# Patient Record
Sex: Female | Born: 1990
Health system: Southern US, Community
[De-identification: ages and names within clinical notes are randomized; demographics above are authoritative.]

## PROBLEM LIST (undated history)

## (undated) DIAGNOSIS — I509 Heart failure, unspecified: Secondary | ICD-10-CM

## (undated) DIAGNOSIS — E119 Type 2 diabetes mellitus without complications: Secondary | ICD-10-CM

## (undated) DIAGNOSIS — E7800 Pure hypercholesterolemia, unspecified: Secondary | ICD-10-CM

## (undated) DIAGNOSIS — I10 Essential (primary) hypertension: Secondary | ICD-10-CM

## (undated) DIAGNOSIS — K219 Gastro-esophageal reflux disease without esophagitis: Secondary | ICD-10-CM

## (undated) DIAGNOSIS — H547 Unspecified visual loss: Secondary | ICD-10-CM

## (undated) HISTORY — PX: INCISION AND DRAINAGE BREAST ABSCESS: SUR672

---

## 2010-02-18 ENCOUNTER — Emergency Department (HOSPITAL_BASED_OUTPATIENT_CLINIC_OR_DEPARTMENT_OTHER): Admission: EM | Admit: 2010-02-18 | Discharge: 2010-02-18 | Payer: Self-pay | Admitting: Emergency Medicine

## 2010-03-19 ENCOUNTER — Encounter: Payer: Self-pay | Admitting: Emergency Medicine

## 2010-03-19 ENCOUNTER — Inpatient Hospital Stay (HOSPITAL_COMMUNITY): Admission: AD | Admit: 2010-03-19 | Discharge: 2010-03-21 | Payer: Self-pay | Admitting: Internal Medicine

## 2010-03-19 ENCOUNTER — Ambulatory Visit: Payer: Self-pay | Admitting: Diagnostic Radiology

## 2010-09-17 LAB — DIFFERENTIAL
Basophils Absolute: 0.1 10*3/uL (ref 0.0–0.1)
Basophils Absolute: 0 10*3/uL (ref 0.0–0.1)
Basophils Relative: 0 % (ref 0–1)
Basophils Relative: 0 % (ref 0–1)
Eosinophils Absolute: 0 10*3/uL (ref 0.0–0.7)
Eosinophils Absolute: 0 10*3/uL (ref 0.0–0.7)
Eosinophils Relative: 1 % (ref 0–5)
Lymphocytes Relative: 57 % — ABNORMAL HIGH (ref 12–46)
Lymphs Abs: 3.1 10*3/uL (ref 0.7–4.0)
Monocytes Relative: 8 % (ref 3–12)
Monocytes Relative: 9 % (ref 3–12)
Neutro Abs: 15.4 10*3/uL — ABNORMAL HIGH (ref 1.7–7.7)
Neutro Abs: 3.1 10*3/uL (ref 1.7–7.7)
Neutrophils Relative %: 73 % (ref 43–77)
Neutrophils Relative %: 72 % (ref 43–77)

## 2010-09-17 LAB — CULTURE, BLOOD (ROUTINE X 2)
Culture  Setup Time: 201109151630
Culture: NO GROWTH
Culture: NO GROWTH

## 2010-09-17 LAB — BASIC METABOLIC PANEL
BUN: 14 mg/dL (ref 6–23)
BUN: 20 mg/dL (ref 6–23)
BUN: 8 mg/dL (ref 6–23)
CO2: 14 mEq/L — ABNORMAL LOW (ref 19–32)
CO2: 15 mEq/L — ABNORMAL LOW (ref 19–32)
CO2: 16 mEq/L — ABNORMAL LOW (ref 19–32)
CO2: 16 mEq/L — ABNORMAL LOW (ref 19–32)
CO2: 19 mEq/L (ref 19–32)
Calcium: 8.1 mg/dL — ABNORMAL LOW (ref 8.4–10.5)
Calcium: 8.7 mg/dL (ref 8.4–10.5)
Chloride: 106 mEq/L (ref 96–112)
Chloride: 107 mEq/L (ref 96–112)
Chloride: 109 mEq/L (ref 96–112)
Chloride: 110 mEq/L (ref 96–112)
Creatinine, Ser: 0.85 mg/dL (ref 0.4–1.2)
Creatinine, Ser: 0.9 mg/dL (ref 0.4–1.2)
Creatinine, Ser: 1.38 mg/dL — ABNORMAL HIGH (ref 0.4–1.2)
GFR calc Af Amer: 58 mL/min — ABNORMAL LOW (ref >60)
GFR calc Af Amer: >60 mL/min (ref >60)
GFR calc Af Amer: >60 mL/min (ref >60)
GFR calc non Af Amer: 58 mL/min — ABNORMAL LOW (ref >60)
GFR calc non Af Amer: >60 mL/min (ref >60)
GFR calc non Af Amer: >60 mL/min (ref >60)
GFR calc non Af Amer: >60 mL/min (ref >60)
Glucose, Bld: 196 mg/dL — ABNORMAL HIGH (ref 70–99)
Glucose, Bld: 276 mg/dL — ABNORMAL HIGH (ref 70–99)
Glucose, Bld: 292 mg/dL — ABNORMAL HIGH (ref 70–99)
Glucose, Bld: 400 mg/dL — ABNORMAL HIGH (ref 70–99)
Potassium: 3.3 mEq/L — ABNORMAL LOW (ref 3.5–5.1)
Potassium: 3.7 mEq/L (ref 3.5–5.1)
Potassium: 4.2 mEq/L (ref 3.5–5.1)
Potassium: 4.3 mEq/L (ref 3.5–5.1)
Potassium: 4.6 mEq/L (ref 3.5–5.1)
Sodium: 134 mEq/L — ABNORMAL LOW (ref 135–145)
Sodium: 136 mEq/L (ref 135–145)
Sodium: 137 mEq/L (ref 135–145)
Sodium: 138 mEq/L (ref 135–145)

## 2010-09-17 LAB — BLOOD GAS, ARTERIAL
Acid-base deficit: 12.3 mmol/L — ABNORMAL HIGH (ref 0.0–2.0)
Drawn by: 229971
pCO2 arterial: 25.2 mmHg — ABNORMAL LOW (ref 35.0–45.0)
pH, Arterial: 7.31 — ABNORMAL LOW (ref 7.350–7.400)

## 2010-09-17 LAB — COMPREHENSIVE METABOLIC PANEL
ALT: 23 U/L (ref 0–35)
Alkaline Phosphatase: 120 U/L — ABNORMAL HIGH (ref 39–117)
BUN: 31 mg/dL — ABNORMAL HIGH (ref 6–23)
CO2: 12 mEq/L — ABNORMAL LOW (ref 19–32)
Chloride: 104 mEq/L (ref 96–112)
Glucose, Bld: 183 mg/dL — ABNORMAL HIGH (ref 70–99)
Potassium: 5.2 mEq/L — ABNORMAL HIGH (ref 3.5–5.1)
Sodium: 144 mEq/L (ref 135–145)
Total Bilirubin: 1.1 mg/dL (ref 0.3–1.2)
Total Protein: 10.8 g/dL — ABNORMAL HIGH (ref 6.0–8.3)

## 2010-09-17 LAB — CBC
HCT: 47.2 % — ABNORMAL HIGH (ref 36.0–46.0)
HCT: 38.4 % (ref 36.0–46.0)
Hemoglobin: 15.9 g/dL — ABNORMAL HIGH (ref 12.0–15.0)
Hemoglobin: 12.9 g/dL (ref 12.0–15.0)
MCH: 26.6 pg (ref 26.0–34.0)
MCHC: 34.1 g/dL (ref 30.0–36.0)
MCV: 78.7 fL (ref 78.0–100.0)
MCV: 79.6 fL (ref 78.0–100.0)
Platelets: 238 10*3/uL (ref 150–400)
RBC: 6 MIL/uL — ABNORMAL HIGH (ref 3.87–5.11)
RBC: 4.83 MIL/uL (ref 3.87–5.11)
RDW: 13.3 % (ref 11.5–15.5)
RDW: 14.3 % (ref 11.5–15.5)
WBC: 21 10*3/uL — ABNORMAL HIGH (ref 4.0–10.5)
WBC: 8.4 10*3/uL (ref 4.0–10.5)

## 2010-09-17 LAB — URINALYSIS, ROUTINE W REFLEX MICROSCOPIC
Glucose, UA: 250 mg/dL — AB
Ketones, ur: >80 mg/dL — AB
Protein, ur: 30 mg/dL — AB
pH: 5.5 (ref 5.0–8.0)

## 2010-09-17 LAB — GLUCOSE, CAPILLARY
Glucose-Capillary: 190 mg/dL — ABNORMAL HIGH (ref 70–99)
Glucose-Capillary: 153 mg/dL — ABNORMAL HIGH (ref 70–99)
Glucose-Capillary: 228 mg/dL — ABNORMAL HIGH (ref 70–99)
Glucose-Capillary: 237 mg/dL — ABNORMAL HIGH (ref 70–99)
Glucose-Capillary: 257 mg/dL — ABNORMAL HIGH (ref 70–99)
Glucose-Capillary: 265 mg/dL — ABNORMAL HIGH (ref 70–99)
Glucose-Capillary: 271 mg/dL — ABNORMAL HIGH (ref 70–99)
Glucose-Capillary: 292 mg/dL — ABNORMAL HIGH (ref 70–99)

## 2010-09-17 LAB — URINE CULTURE: Culture  Setup Time: 201109151814

## 2010-09-17 LAB — HEPATIC FUNCTION PANEL
ALT: 16 U/L (ref 0–35)
AST: 18 U/L (ref 0–37)
Albumin: 3.3 g/dL — ABNORMAL LOW (ref 3.5–5.2)
Total Protein: 6.6 g/dL (ref 6.0–8.3)

## 2010-09-17 LAB — HIV ANTIBODY (ROUTINE TESTING W REFLEX): HIV: NONREACTIVE

## 2010-09-17 LAB — URINE MICROSCOPIC-ADD ON

## 2010-09-17 LAB — HEMOGLOBIN A1C: Mean Plasma Glucose: 364 mg/dL — ABNORMAL HIGH (ref <117)

## 2010-09-17 LAB — PREGNANCY, URINE: Preg Test, Ur: NEGATIVE

## 2010-09-17 LAB — LACTIC ACID, PLASMA: Lactic Acid, Venous: 1.6 mmol/L (ref 0.5–2.2)

## 2010-09-18 LAB — HEMOCCULT GUIAC POC 1CARD (OFFICE): Fecal Occult Bld: POSITIVE

## 2014-10-26 ENCOUNTER — Emergency Department (HOSPITAL_BASED_OUTPATIENT_CLINIC_OR_DEPARTMENT_OTHER): Payer: Worker's Compensation

## 2014-10-26 ENCOUNTER — Emergency Department (HOSPITAL_BASED_OUTPATIENT_CLINIC_OR_DEPARTMENT_OTHER)
Admission: EM | Admit: 2014-10-26 | Discharge: 2014-10-26 | Disposition: A | Discharge status: Home / Self Care | Payer: Worker's Compensation | Attending: Emergency Medicine | Admitting: Emergency Medicine

## 2014-10-26 ENCOUNTER — Encounter (HOSPITAL_BASED_OUTPATIENT_CLINIC_OR_DEPARTMENT_OTHER): Payer: Self-pay

## 2014-10-26 DIAGNOSIS — Z794 Long term (current) use of insulin: Secondary | ICD-10-CM | POA: Diagnosis not present

## 2014-10-26 DIAGNOSIS — E119 Type 2 diabetes mellitus without complications: Secondary | ICD-10-CM | POA: Diagnosis not present

## 2014-10-26 DIAGNOSIS — S99912A Unspecified injury of left ankle, initial encounter: Secondary | ICD-10-CM | POA: Diagnosis present

## 2014-10-26 DIAGNOSIS — Y998 Other external cause status: Secondary | ICD-10-CM | POA: Insufficient documentation

## 2014-10-26 DIAGNOSIS — Y9389 Activity, other specified: Secondary | ICD-10-CM | POA: Insufficient documentation

## 2014-10-26 DIAGNOSIS — Y9289 Other specified places as the place of occurrence of the external cause: Secondary | ICD-10-CM | POA: Diagnosis not present

## 2014-10-26 DIAGNOSIS — X58XXXA Exposure to other specified factors, initial encounter: Secondary | ICD-10-CM | POA: Diagnosis not present

## 2014-10-26 DIAGNOSIS — M25572 Pain in left ankle and joints of left foot: Secondary | ICD-10-CM

## 2014-10-26 HISTORY — DX: Type 2 diabetes mellitus without complications: E11.9

## 2014-10-26 MED ORDER — IBUPROFEN 800 MG PO TABS
800.0000 mg | ORAL_TABLET | Freq: Once | ORAL | Status: AC
  Administered 2014-10-26: 800 mg via ORAL
  Filled 2014-10-26: qty 1

## 2014-10-26 MED ORDER — IBUPROFEN 600 MG PO TABS: 600.0000 mg | ORAL_TABLET | Freq: Three times a day (TID) | ORAL | Status: AC

## 2014-10-26 NOTE — Discharge Instructions (Signed)
As discussed your pain is likely due to a sprain.  However, it is important that you have repeat evaluation in one week appropriate resolution.  Return here for concerning changes in your condition.

## 2014-10-26 NOTE — ED Provider Notes (Signed)
CSN: [id number]     Arrival date & time 10/26/14  1820 History  This chart was scribed for Gerhard Munchobert Gurtej Noyola, MD by [place] Peace, ED Scribe. The patient was seen in [facility]13/[facility]. The patient's care was started at 7:16 PM.    Chief Complaint  Patient presents with  . Ankle Injury      Patient is a 24 y.o. female presenting with lower extremity injury. The history is provided by the patient. No language interpreter was used.  Ankle Injury This is a new problem. The current episode started 1 to 2 hours ago. The problem occurs constantly. The symptoms are aggravated by walking and standing.  HPI Comments: Tricia Boone is a [age] y.o. female who presents to the Emergency Department complaining of left ankle injury that occurred earlier today while pt was coming down a slide and put her foot out to stop herself and rolled her ankle. Pt is not sure in which direction she twisted her ankle but reports pain is mostly to medial aspect of her ankle. She now complains of severe left ankle pain, swelling, and tenderness. History of DM. Pt is non-smoker.    Past Medical History  Diagnosis Date  . Diabetes mellitus without complication    History reviewed. No pertinent past surgical history. No family history on file. History  Substance Use Topics  . Smoking status: Never Smoker   . Smokeless tobacco: Not on file  . Alcohol Use: No   OB History    No data available     Review of Systems  All other systems reviewed and are negative.     Allergies  Review of patient's allergies indicates no known allergies.  Home Medications   Prior to Admission medications   Medication Sig Start Date End Date Taking? Authorizing Provider  insulin aspart (NOVOLOG) 100 UNIT/ML injection Inject into the skin 3 (three) times daily before meals.   Yes Historical Provider, MD  insulin detemir (LEVEMIR) 100 UNIT/ML injection Inject into the skin at bedtime.   Yes Historical Provider, MD  ibuprofen  (ADVIL,MOTRIN) 600 MG tablet Take 1 tablet (600 mg total) by mouth 3 (three) times daily. 10/26/14 10/29/14  Gerhard Munchobert Eilidh Marcano, MD   BP 131/99 mmHg  Pulse 95  Temp(Src) 98.8 F (37.1 C) (Oral)  Resp 18  Ht 5' 2.5" (1.588 m)  Wt 130 lb (58.968 kg)  BMI 23.38 kg/m2  SpO2 100%  LMP 10/15/2014 Physical Exam  Constitutional: She is oriented to person, place, and time. She appears well-developed and well-nourished. No distress.  HENT:  Head: Normocephalic and atraumatic.  Eyes: Conjunctivae and EOM are normal.  Cardiovascular: Normal rate and regular rhythm.   Pulmonary/Chest: Effort normal and breath sounds normal. No stridor. No respiratory distress.  Abdominal: She exhibits no distension.  Musculoskeletal: She exhibits edema and tenderness.  Tenderness to anterior medial malleolus and TFL. More pain with eversion compared to inversion.   Neurological: She is alert and oriented to person, place, and time. No cranial nerve deficit.  Skin: Skin is warm and dry.  Psychiatric: She has a normal mood and affect.  Nursing note and vitals reviewed.   ED Course  Procedures (including critical care time) Labs Review Labs Reviewed - No data to display  Imaging Review Dg Ankle Complete Left  10/26/2014   CLINICAL DATA:  LEFT foot injury. Twisted LEFT foot. Ankle pain and swelling.  EXAM: LEFT ANKLE COMPLETE - 3+ VIEW  COMPARISON:  None.  FINDINGS: There is no evidence of fracture,  dislocation, or joint effusion. There is no evidence of arthropathy or other focal bone abnormality. Soft tissues are unremarkable.  IMPRESSION: Negative.   Electronically Signed   By: Andreas Newport M.D.   On: 10/26/2014 19:28   Dg Foot Complete Left  10/26/2014   CLINICAL DATA:  Left ankle trauma and twisting injury with pain  EXAM: LEFT FOOT - COMPLETE 3+ VIEW  COMPARISON:  None.  FINDINGS: There is no evidence of fracture or dislocation. There is no evidence of arthropathy or other focal bone abnormality. Soft  tissues are unremarkable.  IMPRESSION: Negative.   Electronically Signed   By: Christiana Pellant M.D.   On: 10/26/2014 19:31  reviewed the x-rays, agree with the interpretation. I demonstrated them to the patient and her companion.    EKG Interpretation None     Medications  ibuprofen (ADVIL,MOTRIN) tablet 800 mg (800 mg Oral Given 10/26/14 2000)    7:21 PM- Treatment plan was discussed with patient who verbalizes understanding and agrees.   MDM   Final diagnoses:  Ankle pain, left     I personally performed the services described in this documentation, which was scribed in my presence. The recorded information has been reviewed and is accurate.   Well-appearing female presents after trauma to the left ankle. No evidence for proximal injury, nor for fracture on x-rays. Patient had splint, crutches, cryotherapy, analgesia, was discharged with a sports medicine follow-up.   Gerhard Munch, MD 10/27/14 [phone]

## 2014-10-26 NOTE — ED Notes (Addendum)
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

## 2014-11-03 ENCOUNTER — Encounter (HOSPITAL_BASED_OUTPATIENT_CLINIC_OR_DEPARTMENT_OTHER): Payer: Self-pay | Admitting: *Deleted

## 2014-11-03 ENCOUNTER — Emergency Department (HOSPITAL_BASED_OUTPATIENT_CLINIC_OR_DEPARTMENT_OTHER)
Admission: EM | Admit: 2014-11-03 | Discharge: 2014-11-03 | Disposition: A | Discharge status: Home / Self Care | Payer: Self-pay | Attending: Emergency Medicine | Admitting: Emergency Medicine

## 2014-11-03 DIAGNOSIS — E119 Type 2 diabetes mellitus without complications: Secondary | ICD-10-CM | POA: Insufficient documentation

## 2014-11-03 DIAGNOSIS — Z794 Long term (current) use of insulin: Secondary | ICD-10-CM | POA: Insufficient documentation

## 2014-11-03 DIAGNOSIS — L02214 Cutaneous abscess of groin: Secondary | ICD-10-CM | POA: Insufficient documentation

## 2014-11-03 LAB — CBG MONITORING, ED: GLUCOSE-CAPILLARY: 315 mg/dL — AB (ref 70–99)

## 2014-11-03 MED ORDER — DOXYCYCLINE HYCLATE 100 MG PO CAPS
100.0000 mg | ORAL_CAPSULE | Freq: Two times a day (BID) | ORAL | Status: DC
Start: 2014-11-03 — End: 2015-08-26

## 2014-11-03 MED ORDER — DOXYCYCLINE HYCLATE 100 MG PO TABS
100.0000 mg | ORAL_TABLET | Freq: Once | ORAL | Status: AC
  Administered 2014-11-03: 100 mg via ORAL
  Filled 2014-11-03: qty 1

## 2014-11-03 MED ORDER — NAPROXEN 500 MG PO TABS: 500.0000 mg | ORAL_TABLET | Freq: Two times a day (BID) | ORAL | Status: DC

## 2014-11-03 NOTE — Discharge Instructions (Signed)
Soak the area in warm water for 20 minutes each day. Take antibiotic as directed. Take Naprosyn as directed. Work note provided. Return for any new or worse symptoms. Would expect things to improve over the next 3 days.

## 2014-11-03 NOTE — ED Notes (Signed)
Presents to ED w/ c/o pain of possible abscess at rt side of suprpubic area, onset last PM, states has drainage this am.

## 2014-11-03 NOTE — ED Notes (Signed)
Reports abscess 'burst' this morning in her (R) groin.

## 2014-11-03 NOTE — ED Provider Notes (Signed)
CSN: [id number]     Arrival date & time 11/03/14  1447 History  This chart was scribed for Tricia MuldersScott Allsion Nogales, MD by Roxy Cedarhandni Bhalodia, ED Scribe. This patient was seen in room MH04/MH04 and the patient's care was started at 3:42 PM.   Chief Complaint  Patient presents with  . Abscess   Patient is a 24 y.o. female presenting with abscess. The history is provided by the patient. No language interpreter was used.  Abscess Location:  Torso Torso abscess location:  Abd RLQ Associated symptoms: no fever, no headaches, no nausea and no vomiting    HPI Comments: Tricia Boone is a 24 y.o. female with a PMHx of diabetes, who presents to the Emergency Department complaining of abscess to right side of pubic area onset 3 days ago. She states that it initially began looking like a "hair bump" a few days ago and went away. She states that the bump reappearedPatient states that the abscess began draining this morning. Patient has no known allergies. Patient denies chance of pregnancy.  Past Medical History  Diagnosis Date  . Diabetes mellitus without complication    History reviewed. No pertinent past surgical history. History reviewed. No pertinent family history. History  Substance Use Topics  . Smoking status: Never Smoker   . Smokeless tobacco: Not on file  . Alcohol Use: No   OB History    No data available     Review of Systems  Constitutional: Negative for fever and chills.  HENT: Negative for congestion, rhinorrhea and sore throat.   Eyes: Negative for visual disturbance.  Respiratory: Negative for cough and shortness of breath.   Cardiovascular: Negative for chest pain and leg swelling.  Gastrointestinal: Negative for nausea, vomiting and diarrhea.  Genitourinary: Negative for dysuria.  Musculoskeletal: Negative for back pain.  Skin: Positive for wound (abscess). Negative for rash.  Neurological: Negative for headaches.  Hematological: Does not bruise/bleed easily.   Psychiatric/Behavioral: Negative for confusion.   Allergies  Review of patient's allergies indicates no known allergies.  Home Medications   Prior to Admission medications   Medication Sig Start Date End Date Taking? Authorizing Provider  insulin aspart (NOVOLOG) 100 UNIT/ML injection Inject into the skin 3 (three) times daily before meals.    Historical Provider, MD  insulin detemir (LEVEMIR) 100 UNIT/ML injection Inject into the skin at bedtime.    Historical Provider, MD   Triage Vitals: BP 137/90 mmHg  Pulse 102  Temp(Src) 98.4 F (36.9 C) (Oral)  Resp 16  Ht 5\' 2"  (1.575 m)  Wt 125 lb (56.7 kg)  BMI 22.86 kg/m2  SpO2 100%  LMP 10/15/2014  Physical Exam  Constitutional: She is oriented to person, place, and time. She appears well-developed and well-nourished. No distress.  HENT:  Head: Normocephalic and atraumatic.  Mouth/Throat: Oropharynx is clear and moist. No oropharyngeal exudate.  Eyes: Conjunctivae and EOM are normal. Pupils are equal, round, and reactive to light. Right eye exhibits no discharge. Left eye exhibits no discharge. No scleral icterus.  Cardiovascular: Normal rate, regular rhythm and normal heart sounds.   No murmur heard. Pulmonary/Chest: Effort normal and breath sounds normal. No respiratory distress.  Abdominal: Soft. Bowel sounds are normal. There is no tenderness.  Musculoskeletal:  Ankle wrap noted to left ankle. No swelling noted to ankles bilaterally.  Neurological: She is alert and oriented to person, place, and time.  Skin: She is not diaphoretic. There is erythema.  Right groin area: induration is 2cm; oozing puss in area  of 5 cm. Redness in area of 1 cm. No swelling in labia on right side.  Psychiatric: She has a normal mood and affect. Her behavior is normal.  Nursing note and vitals reviewed.  ED Course  Procedures (including critical care time)  DIAGNOSTIC STUDIES: Oxygen Saturation is 100% on RA, normal by my interpretation.     COORDINATION OF CARE: 3:47 PM- Discussed plans to give patient presecription for doxycycyline. Advised patient to soak. Pt advised of plan for treatment and pt agrees.  Labs Review Labs Reviewed  CBG MONITORING, ED - Abnormal; Notable for the following:    Glucose-Capillary 315 (*)    All other components within normal limits   Imaging Review No results found.   EKG Interpretation None     MDM   Final diagnoses:  None    Patient with small right groin area abscess most likely infected hair follicle already draining pus. Will treat with warm water soaks and antibiotic and anti-inflammatory medicine. Patient given precautions.  Patient is known diabetic. Patient without any other significant symptoms. No fevers. Blood sugar is slightly elevated here today at 315.  I personally performed the services described in this documentation, which was scribed in my presence. The recorded information has been reviewed and is accurate.    Tricia Mulders, MD 11/03/14 [phone]

## 2014-11-09 ENCOUNTER — Encounter (HOSPITAL_BASED_OUTPATIENT_CLINIC_OR_DEPARTMENT_OTHER): Payer: Self-pay

## 2014-11-09 ENCOUNTER — Emergency Department (HOSPITAL_BASED_OUTPATIENT_CLINIC_OR_DEPARTMENT_OTHER)
Admission: EM | Admit: 2014-11-09 | Discharge: 2014-11-09 | Disposition: A | Discharge status: Home / Self Care | Payer: Self-pay | Attending: Emergency Medicine | Admitting: Emergency Medicine

## 2014-11-09 DIAGNOSIS — Z792 Long term (current) use of antibiotics: Secondary | ICD-10-CM | POA: Insufficient documentation

## 2014-11-09 DIAGNOSIS — M79641 Pain in right hand: Secondary | ICD-10-CM | POA: Insufficient documentation

## 2014-11-09 DIAGNOSIS — Z79899 Other long term (current) drug therapy: Secondary | ICD-10-CM | POA: Insufficient documentation

## 2014-11-09 DIAGNOSIS — M79642 Pain in left hand: Secondary | ICD-10-CM | POA: Insufficient documentation

## 2014-11-09 DIAGNOSIS — Z794 Long term (current) use of insulin: Secondary | ICD-10-CM | POA: Insufficient documentation

## 2014-11-09 DIAGNOSIS — E119 Type 2 diabetes mellitus without complications: Secondary | ICD-10-CM | POA: Insufficient documentation

## 2014-11-09 DIAGNOSIS — M79643 Pain in unspecified hand: Secondary | ICD-10-CM

## 2014-11-09 DIAGNOSIS — Z791 Long term (current) use of non-steroidal anti-inflammatories (NSAID): Secondary | ICD-10-CM | POA: Insufficient documentation

## 2014-11-09 MED ORDER — GABAPENTIN 100 MG PO CAPS: 100.0000 mg | ORAL_CAPSULE | Freq: Three times a day (TID) | ORAL | Status: DC

## 2014-11-09 MED ORDER — TRAMADOL HCL 50 MG PO TABS: 50.0000 mg | ORAL_TABLET | Freq: Four times a day (QID) | ORAL | Status: DC | PRN

## 2014-11-09 NOTE — ED Notes (Addendum)
MD in to see patient 

## 2014-11-09 NOTE — ED Notes (Signed)
Pt reports starting this morning with persistent pain at tips of fingers bilateral.  Hx of diabetes, no injury.

## 2014-11-09 NOTE — ED Provider Notes (Signed)
CSN: [id number]     Arrival date & time 11/09/14  1847 History  This chart was scribed for Tricia RazorStephen Darnice Comrie, MD by Tricia Boone, ED Scribe. This patient was seen in room MH07/MH07 and the patient's care was started at 7:46 PM.    Chief Complaint  Patient presents with  . Hand Pain     Patient is a 24 y.o. female presenting with hand pain. The history is provided by the patient. No language interpreter was used.  Hand Pain   HPI Comments: Tricia Boone is a 24 y.o. female with PMHx of DM who presents to the Emergency Department complaining of constant worsening throbbing sharp pain to all fingertips of bilateral hands. She states "feels like someone is stabbing me in my fingers." Pt reports touch and movement aggravates the pain. Pt denies any known alleviating factors. Pt denies h/o diabetic neuropathy. Pt denies known injury, swelling, numbness, tingling, and pain in bilateral LEs.   Pt was seen in ED on 11/03/14 for abscess to groin. She reports improvements in pain. She is unsure if it is healing properly.    Past Medical History  Diagnosis Date  . Diabetes mellitus without complication    Past Surgical History  Procedure Laterality Date  . Incision and drainage breast abscess     No family history on file. History  Substance Use Topics  . Smoking status: Never Smoker   . Smokeless tobacco: Not on file  . Alcohol Use: No   OB History    No data available     Review of Systems  Musculoskeletal: Positive for myalgias. Negative for joint swelling.  Neurological: Negative for weakness and numbness.  All other systems reviewed and are negative.     Allergies  Review of patient's allergies indicates no known allergies.  Home Medications   Prior to Admission medications   Medication Sig Start Date End Date Taking? Authorizing Provider  doxycycline (VIBRAMYCIN) 100 MG capsule Take 1 capsule (100 mg total) by mouth 2 (two) times daily. 11/03/14   Vanetta MuldersScott Zackowski, MD   insulin aspart (NOVOLOG) 100 UNIT/ML injection Inject into the skin 3 (three) times daily before meals.    Historical Provider, MD  insulin detemir (LEVEMIR) 100 UNIT/ML injection Inject into the skin at bedtime.    Historical Provider, MD  naproxen (NAPROSYN) 500 MG tablet Take 1 tablet (500 mg total) by mouth 2 (two) times daily. 11/03/14   Vanetta MuldersScott Zackowski, MD   BP 161/96 mmHg  Pulse 97  Temp(Src) 98.7 F (37.1 C) (Oral)  Resp 18  Ht 5' 2.5" (1.588 m)  Wt 125 lb (56.7 kg)  BMI 22.48 kg/m2  SpO2 100%  LMP 10/15/2014 Physical Exam  Constitutional: She appears well-developed and well-nourished. No distress.  HENT:  Head: Normocephalic and atraumatic.  Eyes: Conjunctivae are normal. Right eye exhibits no discharge. Left eye exhibits no discharge.  Neck: Neck supple.  Cardiovascular: Normal rate, regular rhythm and normal heart sounds.  Exam reveals no gallop and no friction rub.   No murmur heard. Pulmonary/Chest: Effort normal and breath sounds normal. No respiratory distress.  Abdominal: Soft. She exhibits no distension. There is no tenderness.  Musculoskeletal: She exhibits no edema or tenderness.  Hands and fingers normal in appearance no swelling or rash Sensation intact Brisk cap refill  Neurological: She is alert.  Skin: Skin is warm and dry.  Lesion to right mons consistent with healing abscess; no drainage, fluctuance, no cellulitis  Psychiatric: She has a normal mood and  affect. Her behavior is normal. Thought content normal.  Nursing note and vitals reviewed.   ED Course  Procedures (including critical care time) DIAGNOSTIC STUDIES: Oxygen Saturation is 100% on room air, normal by my interpretation.    COORDINATION OF CARE: 7:49 PM Discussed treatment plan with patient at beside, the patient agrees with the plan and has no further questions at this time.  Labs Review Labs Reviewed - No data to display  Imaging Review No results found.   EKG  Interpretation None      MDM   Final diagnoses:  Pain of hand, unspecified laterality   24yF with b/l finger tips pain. Unclear etiology. Long standing hx of diabetes. Consider neuropathy. Exam nonfocal. Fingers well perfused. No swelling or other skin changes. Neurologically intact. Plan symptomatic tx at this time. Abscess to R mons pt was recently evaluated for appears to be healing well.   I personally preformed the services scribed in my presence. The recorded information has been reviewed is accurate. Tricia RazorStephen Evaline Waltman, MD.     Tricia RazorStephen Treyton Slimp, MD 11/12/14 [phone]

## 2015-02-22 ENCOUNTER — Encounter (HOSPITAL_BASED_OUTPATIENT_CLINIC_OR_DEPARTMENT_OTHER): Payer: Self-pay | Admitting: *Deleted

## 2015-02-22 ENCOUNTER — Emergency Department (HOSPITAL_BASED_OUTPATIENT_CLINIC_OR_DEPARTMENT_OTHER)
Admission: EM | Admit: 2015-02-22 | Discharge: 2015-02-22 | Disposition: A | Discharge status: Home / Self Care | Payer: Self-pay | Attending: Emergency Medicine | Admitting: Emergency Medicine

## 2015-02-22 DIAGNOSIS — E1042 Type 1 diabetes mellitus with diabetic polyneuropathy: Secondary | ICD-10-CM | POA: Insufficient documentation

## 2015-02-22 DIAGNOSIS — Z791 Long term (current) use of non-steroidal anti-inflammatories (NSAID): Secondary | ICD-10-CM | POA: Insufficient documentation

## 2015-02-22 DIAGNOSIS — Z79899 Other long term (current) drug therapy: Secondary | ICD-10-CM | POA: Insufficient documentation

## 2015-02-22 DIAGNOSIS — Z794 Long term (current) use of insulin: Secondary | ICD-10-CM | POA: Insufficient documentation

## 2015-02-22 DIAGNOSIS — R202 Paresthesia of skin: Secondary | ICD-10-CM | POA: Insufficient documentation

## 2015-02-22 MED ORDER — GABAPENTIN 100 MG PO CAPS
100.0000 mg | ORAL_CAPSULE | Freq: Once | ORAL | Status: AC
  Administered 2015-02-22: 100 mg via ORAL
  Filled 2015-02-22: qty 1

## 2015-02-22 MED ORDER — GABAPENTIN 100 MG PO CAPS: 100.0000 mg | ORAL_CAPSULE | Freq: Three times a day (TID) | ORAL | Status: DC

## 2015-02-22 MED ORDER — TRAMADOL HCL 50 MG PO TABS: 50.0000 mg | ORAL_TABLET | Freq: Four times a day (QID) | ORAL | Status: DC | PRN

## 2015-02-22 MED ORDER — TRAMADOL HCL 50 MG PO TABS: 100.0000 mg | ORAL_TABLET | Freq: Once | ORAL | Status: DC

## 2015-02-22 MED ORDER — TRAMADOL HCL 50 MG PO TABS
50.0000 mg | ORAL_TABLET | Freq: Once | ORAL | Status: AC
  Administered 2015-02-22: 50 mg via ORAL
  Filled 2015-02-22: qty 1

## 2015-02-22 NOTE — ED Notes (Signed)
Pt admits to intermittent bilat hand numbness that radiates up both arms that began approx 0330 today.

## 2015-02-22 NOTE — ED Provider Notes (Addendum)
CSN: [id number]     Arrival date & time 02/22/15  0528 History   First MD Initiated Contact with Patient 02/22/15 0543     Chief Complaint  Patient presents with  . Hand Problem     (Consider location/radiation/quality/duration/timing/severity/associated sxs/prior Treatment) HPI  This is a 24 year old female with a history of type 1 diabetes and diabetic neuropathy. She was seen several months ago for pain and paresthesias in her fingertips and was prescribed gabapentin and tramadol. She had good relief from these medications but has not followed up with her PCP. She is here with pain and paresthesias in her fingertips that awakened her from sleep about 3:30 this morning. She states the pain is an 11 out of 10. The pain radiates up her forearms. It does not follow an ulnar or median nerve pattern. There is no motor deficit. Pain is reproduced with palpation or movement of her fingers.  Past Medical History  Diagnosis Date  . Diabetes mellitus without complication    Past Surgical History  Procedure Laterality Date  . Incision and drainage breast abscess     History reviewed. No pertinent family history. Social History  Substance Use Topics  . Smoking status: Never Smoker   . Smokeless tobacco: None  . Alcohol Use: No   OB History    No data available     Review of Systems  All other systems reviewed and are negative.   Allergies  Review of patient's allergies indicates no known allergies.  Home Medications   Prior to Admission medications   Medication Sig Start Date End Date Taking? Authorizing Provider  doxycycline (VIBRAMYCIN) 100 MG capsule Take 1 capsule (100 mg total) by mouth 2 (two) times daily. 11/03/14   Vanetta Mulders, MD  gabapentin (NEURONTIN) 100 MG capsule Take 1 capsule (100 mg total) by mouth 3 (three) times daily. 11/09/14   Raeford Razor, MD  insulin aspart (NOVOLOG) 100 UNIT/ML injection Inject into the skin 3 (three) times daily before meals.     Historical Provider, MD  insulin detemir (LEVEMIR) 100 UNIT/ML injection Inject into the skin at bedtime.    Historical Provider, MD  naproxen (NAPROSYN) 500 MG tablet Take 1 tablet (500 mg total) by mouth 2 (two) times daily. 11/03/14   Vanetta Mulders, MD  traMADol (ULTRAM) 50 MG tablet Take 1 tablet (50 mg total) by mouth every 6 (six) hours as needed. 11/09/14   Raeford Razor, MD   BP 134/87 mmHg  Pulse 92  Temp(Src) 98 F (36.7 C) (Oral)  Resp 20  Ht 5' 2.5" (1.588 m)  SpO2 100%  LMP 01/23/2015   Physical Exam  General: Well-developed, well-nourished female in no acute distress; appearance consistent with age of record HENT: normocephalic; atraumatic Eyes: Normal appearance Neck: supple Heart: regular rate and rhythm Lungs: clear to auscultation bilaterally Abdomen: soft; nondistended Extremities: No deformity; full range of motion; pulses normal Neurologic: Awake, alert and oriented; motor function intact in all extremities and symmetric; no facial droop; negative Phalen's; negative Tinel's bilaterally; pain reproducible on palpation of fingertips Skin: Warm and dry Psychiatric: Normal mood and affect    ED Course  Procedures (including critical care time)   MDM  History and exam consistent with diabetic neuropathy. She was encouraged follow-up with her primary care physician.   Paula Libra, MD 02/22/15 1191  Paula Libra, MD 02/22/15 [phone]

## 2015-05-15 ENCOUNTER — Encounter (HOSPITAL_BASED_OUTPATIENT_CLINIC_OR_DEPARTMENT_OTHER): Payer: Self-pay | Admitting: *Deleted

## 2015-05-15 ENCOUNTER — Emergency Department (HOSPITAL_BASED_OUTPATIENT_CLINIC_OR_DEPARTMENT_OTHER)
Admission: EM | Admit: 2015-05-15 | Discharge: 2015-05-15 | Disposition: A | Discharge status: Other Acute Inpatient Hospital | Payer: Self-pay | Attending: Emergency Medicine | Admitting: Emergency Medicine

## 2015-05-15 DIAGNOSIS — E872 Acidosis, unspecified: Secondary | ICD-10-CM

## 2015-05-15 DIAGNOSIS — Z79899 Other long term (current) drug therapy: Secondary | ICD-10-CM | POA: Insufficient documentation

## 2015-05-15 DIAGNOSIS — E1010 Type 1 diabetes mellitus with ketoacidosis without coma: Secondary | ICD-10-CM | POA: Insufficient documentation

## 2015-05-15 DIAGNOSIS — R0602 Shortness of breath: Secondary | ICD-10-CM | POA: Insufficient documentation

## 2015-05-15 DIAGNOSIS — R000 Tachycardia, unspecified: Secondary | ICD-10-CM | POA: Insufficient documentation

## 2015-05-15 DIAGNOSIS — Z794 Long term (current) use of insulin: Secondary | ICD-10-CM | POA: Insufficient documentation

## 2015-05-15 DIAGNOSIS — Z791 Long term (current) use of non-steroidal anti-inflammatories (NSAID): Secondary | ICD-10-CM | POA: Insufficient documentation

## 2015-05-15 DIAGNOSIS — Z3202 Encounter for pregnancy test, result negative: Secondary | ICD-10-CM | POA: Insufficient documentation

## 2015-05-15 DIAGNOSIS — E860 Dehydration: Secondary | ICD-10-CM | POA: Insufficient documentation

## 2015-05-15 LAB — CBG MONITORING, ED
GLUCOSE-CAPILLARY: 110 mg/dL — AB (ref 65–99)
Glucose-Capillary: 401 mg/dL — ABNORMAL HIGH (ref 65–99)
Glucose-Capillary: 116 mg/dL — ABNORMAL HIGH (ref 65–99)

## 2015-05-15 LAB — CBC WITH DIFFERENTIAL/PLATELET
BASOS ABS: 0 10*3/uL (ref 0.0–0.1)
Basophils Relative: 0 %
EOS ABS: 0 10*3/uL (ref 0.0–0.7)
Eosinophils Relative: 0 %
HCT: 37.5 % (ref 36.0–46.0)
Hemoglobin: 12.2 g/dL (ref 12.0–15.0)
LYMPHS ABS: 4.2 10*3/uL — AB (ref 0.7–4.0)
Lymphocytes Relative: 26 %
MCH: 25.8 pg — AB (ref 26.0–34.0)
MCHC: 32.5 g/dL (ref 30.0–36.0)
MCV: 79.3 fL (ref 78.0–100.0)
MONO ABS: 0.8 10*3/uL (ref 0.1–1.0)
Monocytes Relative: 5 %
NEUTROS ABS: 11.1 10*3/uL — AB (ref 1.7–7.7)
Neutrophils Relative %: 69 %
PLATELETS: 421 10*3/uL — AB (ref 150–400)
RBC: 4.73 MIL/uL (ref 3.87–5.11)
RDW: 16.1 % — AB (ref 11.5–15.5)
WBC: 16.1 10*3/uL — ABNORMAL HIGH (ref 4.0–10.5)

## 2015-05-15 LAB — BASIC METABOLIC PANEL
Anion gap: 25 — ABNORMAL HIGH (ref 5–15)
BUN: 27 mg/dL — ABNORMAL HIGH (ref 6–20)
CALCIUM: 8.8 mg/dL — AB (ref 8.9–10.3)
CO2: 9 mmol/L — ABNORMAL LOW (ref 22–32)
CREATININE: 1.61 mg/dL — AB (ref 0.44–1.00)
Chloride: 101 mmol/L (ref 101–111)
GFR calc Af Amer: 51 mL/min — ABNORMAL LOW (ref >60)
GFR, EST NON AFRICAN AMERICAN: 44 mL/min — AB (ref >60)
Glucose, Bld: 318 mg/dL — ABNORMAL HIGH (ref 65–99)
Potassium: 3.6 mmol/L (ref 3.5–5.1)
SODIUM: 135 mmol/L (ref 135–145)

## 2015-05-15 LAB — I-STAT CG4 LACTIC ACID, ED
LACTIC ACID, VENOUS: 9.18 mmol/L — AB (ref 0.5–2.0)
Lactic Acid, Venous: 8.93 mmol/L (ref 0.5–2.0)

## 2015-05-15 LAB — URINALYSIS, ROUTINE W REFLEX MICROSCOPIC
BILIRUBIN URINE: NEGATIVE
Hgb urine dipstick: NEGATIVE
Leukocytes, UA: NEGATIVE
Nitrite: NEGATIVE
PROTEIN: NEGATIVE mg/dL
Specific Gravity, Urine: 1.028 (ref 1.005–1.030)
Urobilinogen, UA: 0.2 mg/dL (ref 0.0–1.0)
pH: 5.5 (ref 5.0–8.0)

## 2015-05-15 LAB — I-STAT VENOUS BLOOD GAS, ED
Acid-base deficit: 17 mmol/L — ABNORMAL HIGH (ref 0.0–2.0)
Bicarbonate: 10.2 mEq/L — ABNORMAL LOW (ref 20.0–24.0)
O2 SAT: 28 %
PH VEN: 7.155 — AB (ref 7.250–7.300)
TCO2: 11 mmol/L (ref 0–100)
pCO2, Ven: 28.7 mmHg — ABNORMAL LOW (ref 45.0–50.0)
pO2, Ven: 22 mmHg — CL (ref 30.0–45.0)

## 2015-05-15 LAB — URINE MICROSCOPIC-ADD ON

## 2015-05-15 LAB — PREGNANCY, URINE: Preg Test, Ur: NEGATIVE

## 2015-05-15 MED ORDER — SODIUM CHLORIDE 0.9 % IV SOLN: 1000.0000 mL | Freq: Once | INTRAVENOUS | Status: DC

## 2015-05-15 MED ORDER — SODIUM CHLORIDE 0.9 % IV SOLN
1000.0000 mL | Freq: Once | INTRAVENOUS | Status: AC
  Administered 2015-05-15: 1000 mL via INTRAVENOUS

## 2015-05-15 MED ORDER — SODIUM CHLORIDE 0.9 % IV BOLUS (SEPSIS)
1000.0000 mL | Freq: Once | INTRAVENOUS | Status: AC
  Administered 2015-05-15: 1000 mL via INTRAVENOUS

## 2015-05-15 MED ORDER — ONDANSETRON HCL 4 MG/2ML IJ SOLN
4.0000 mg | Freq: Once | INTRAMUSCULAR | Status: AC
  Administered 2015-05-15: 4 mg via INTRAVENOUS
  Filled 2015-05-15: qty 2

## 2015-05-15 MED ORDER — ONDANSETRON 4 MG PO TBDP: 4.0000 mg | ORAL_TABLET | Freq: Once | ORAL | Status: DC

## 2015-05-15 MED ORDER — DEXTROSE-NACL 5-0.45 % IV SOLN
INTRAVENOUS | Status: DC
Start: 2015-05-15 — End: 2015-05-15
  Administered 2015-05-15: 150 mL/h via INTRAVENOUS

## 2015-05-15 MED ORDER — SODIUM CHLORIDE 0.9 % IV SOLN: 1000.0000 mL | INTRAVENOUS | Status: DC

## 2015-05-15 MED ORDER — POTASSIUM CHLORIDE 10 MEQ/100ML IV SOLN
10.0000 meq | INTRAVENOUS | Status: AC
  Administered 2015-05-15: 10 meq via INTRAVENOUS
  Filled 2015-05-15: qty 100

## 2015-05-15 MED ORDER — SODIUM CHLORIDE 0.9 % IV SOLN
INTRAVENOUS | Status: DC
  Administered 2015-05-15: 3.4 [IU]/h via INTRAVENOUS

## 2015-05-15 NOTE — ED Notes (Signed)
Patient states she has a two week history of intermittent dizziness and sob, which is associated with nausea and dry heaves.  States she is a Type 1 DM and does not check her blood sugar on a regular basis. States she has been extremely hungry.

## 2015-05-15 NOTE — ED Notes (Signed)
Iv access attempted x 2, unsuccessful. Pt tolerated well.

## 2015-05-15 NOTE — ED Notes (Signed)
Critical Lactic Acid of 9.18 reported to Dr. Anitra LauthPlunkett.

## 2015-05-15 NOTE — ED Notes (Signed)
CBG 116 RN notified 

## 2015-05-15 NOTE — ED Provider Notes (Signed)
CSN: [id number]     Arrival date & time 05/15/15  [id number] History   First MD Initiated Contact with Patient 05/15/15 [phone]     Chief Complaint  Patient presents with  . Shortness of Breath  . Dizziness     (Consider location/radiation/quality/duration/timing/severity/associated sxs/prior Treatment) Patient is a 24 y.o. female presenting with shortness of breath and dizziness. The history is provided by the patient.  Shortness of Breath Severity:  Moderate Onset quality:  Gradual Duration:  2 days Timing:  Constant Progression:  Worsening Chronicity:  New Context: not URI   Relieved by:  Nothing Worsened by:  Nothing tried Associated symptoms: no abdominal pain, no chest pain, no cough, no fever, no hemoptysis, no neck pain, no sore throat, no sputum production, no vomiting and no wheezing   Associated symptoms comment:  Nausea, lightheadedness, gagging.  Excessive thirst and hunger.  Patient does not check her blood sugar in over 2 weeks but is just been using a standard dose of insulin before meals and 11 units of Levemir at night Risk factors comment:  Type 1 diabetes Dizziness Associated symptoms: shortness of breath   Associated symptoms: no chest pain and no vomiting     Past Medical History  Diagnosis Date  . Diabetes mellitus without complication [facility](HCC)    Past Surgical History  Procedure Laterality Date  . Incision and drainage breast abscess     No family history on file. Social History  Substance Use Topics  . Smoking status: Never Smoker   . Smokeless tobacco: None  . Alcohol Use: No   OB History    No data available     Review of Systems  Constitutional: Negative for fever.  HENT: Negative for sore throat.   Respiratory: Positive for shortness of breath. Negative for cough, hemoptysis, sputum production and wheezing.   Cardiovascular: Negative for chest pain.  Gastrointestinal: Negative for vomiting and abdominal pain.  Musculoskeletal: Negative for neck  pain.  Neurological: Positive for dizziness.  All other systems reviewed and are negative.     Allergies  Review of patient's allergies indicates no known allergies.  Home Medications   Prior to Admission medications   Medication Sig Start Date End Date Taking? Authorizing Provider  acetaminophen (TYLENOL) 325 MG tablet Take 650 mg by mouth every 6 (six) hours as needed.   Yes Historical Provider, MD  gabapentin (NEURONTIN) 100 MG capsule Take 1 capsule (100 mg total) by mouth 3 (three) times daily. 02/22/15  Yes John Molpus, MD  insulin aspart (NOVOLOG) 100 UNIT/ML injection Inject into the skin 3 (three) times daily before meals.   Yes Historical Provider, MD  insulin detemir (LEVEMIR) 100 UNIT/ML injection Inject into the skin at bedtime.   Yes Historical Provider, MD  doxycycline (VIBRAMYCIN) 100 MG capsule Take 1 capsule (100 mg total) by mouth 2 (two) times daily. 11/03/14   Vanetta MuldersScott Zackowski, MD  naproxen (NAPROSYN) 500 MG tablet Take 1 tablet (500 mg total) by mouth 2 (two) times daily. 11/03/14   Vanetta MuldersScott Zackowski, MD  traMADol (ULTRAM) 50 MG tablet Take 1 tablet (50 mg total) by mouth every 6 (six) hours as needed. 02/22/15   John Molpus, MD   BP 111/73 mmHg  Pulse 83  Temp(Src) 98.3 F (36.8 C) (Oral)  Resp 20  Ht 5\' 3"  (1.6 m)  Wt 130 lb (58.968 kg)  BMI 23.03 kg/m2  SpO2 96%  LMP 04/17/2015 Physical Exam  Constitutional: She is oriented to person, place, and time. She appears  well-developed and well-nourished. No distress.  Smells of ketones when walking into the room  HENT:  Head: Normocephalic and atraumatic.  Mouth/Throat: Oropharynx is clear and moist and mucous membranes are normal.  Eyes: Conjunctivae and EOM are normal. Pupils are equal, round, and reactive to light.  Neck: Normal range of motion. Neck supple.  Cardiovascular: Regular rhythm and intact distal pulses.  Tachycardia present.   No murmur heard. Pulmonary/Chest: Effort normal and breath sounds normal.  No respiratory distress. She has no wheezes. She has no rales.  Abdominal: Soft. She exhibits no distension. There is no tenderness. There is no rebound and no guarding.  Musculoskeletal: Normal range of motion. She exhibits no edema or tenderness.  Neurological: She is alert and oriented to person, place, and time.  Skin: Skin is warm and dry. No rash noted. No erythema.  Psychiatric: She has a normal mood and affect. Her behavior is normal.  Nursing note and vitals reviewed.   ED Course  Procedures (including critical care time) Labs Review Labs Reviewed  URINALYSIS, ROUTINE W REFLEX MICROSCOPIC (NOT AT [facility]) - Abnormal; Notable for the following:    Glucose, UA >1000 (*)    Ketones, ur >80 (*)    All other components within normal limits  CBC WITH DIFFERENTIAL/PLATELET - Abnormal; Notable for the following:    WBC 16.1 (*)    MCH 25.8 (*)    RDW 16.1 (*)    Platelets 421 (*)    Neutro Abs 11.1 (*)    Lymphs Abs 4.2 (*)    All other components within normal limits  BASIC METABOLIC PANEL - Abnormal; Notable for the following:    CO2 9 (*)    Glucose, Bld 318 (*)    BUN 27 (*)    Creatinine, Ser 1.61 (*)    Calcium 8.8 (*)    GFR calc non Af Amer 44 (*)    GFR calc Af Amer 51 (*)    Anion gap 25 (*)    All other components within normal limits  URINE MICROSCOPIC-ADD ON - Abnormal; Notable for the following:    Bacteria, UA FEW (*)    All other components within normal limits  CBG MONITORING, ED - Abnormal; Notable for the following:    Glucose-Capillary 401 (*)    All other components within normal limits  I-STAT VENOUS BLOOD GAS, ED - Abnormal; Notable for the following:    pH, Ven 7.155 (*)    pCO2, Ven 28.7 (*)    pO2, Ven 22.0 (*)    Bicarbonate 10.2 (*)    Acid-base deficit 17.0 (*)    All other components within normal limits  I-STAT CG4 LACTIC ACID, ED - Abnormal; Notable for the following:    Lactic Acid, Venous 8.93 (*)    All other components within normal  limits  I-STAT CG4 LACTIC ACID, ED - Abnormal; Notable for the following:    Lactic Acid, Venous 9.18 (*)    All other components within normal limits  PREGNANCY, URINE    Imaging Review No results found. I have personally reviewed and evaluated these images and lab results as part of my medical decision-making.  ED ECG REPORT   Date: 05/15/2015  Rate: 122  Rhythm: sinus tachycardia  QRS Axis: normal  Intervals: normal  ST/T Wave abnormalities: diffuse t-wave inversion  Conduction Disutrbances:none  Narrative Interpretation:   Old EKG Reviewed: unchanged  I have personally reviewed the EKG tracing and agree with the computerized printout as noted.  MDM   Final diagnoses:  Diabetic ketoacidosis without coma associated with type 1 diabetes mellitus (HCC)  Dehydration  Lactic acidosis    Patient presenting day with symptoms concerning for DKA. She smells like he tones when walking into the room. She has had nausea and gagging for the last 2 days with excessive thirst and hunger. She has a history of type 1 diabetes and prior episodes of DKA but it's been some time. Checked her blood sure in the last 2 weeks but states she is just taking her Levemir twice a day and doing a scheduled NovoLog injection before eating. She denies any fever, urinary symptoms, abnormal menses, abdominal pain.  Labs are consistent with DKA. VBG with a pH of 7.15 and a bicarbonate of 10. Lactic acidosis of 9. Urine pregnancy test is negative and UA without signs of infection. Acute kidney injury today with a creatinine of 1.6 from her baseline of 0.8. Potassium is normal at 3.6. EKG without acute findings.  Patient given 2 L of fluid and started on an insulin drip. Potassium was also replaced. Patient will be admitted at [facility] regional ICU  CRITICAL CARE Performed by: Gwyneth Sprout Total critical care time: 30 minutes Critical care time was exclusive of separately billable procedures and  treating other patients. Critical care was necessary to treat or prevent imminent or life-threatening deterioration. Critical care was time spent personally by me on the following activities: development of treatment plan with patient and/or surrogate as well as nursing, discussions with consultants, evaluation of patient's response to treatment, examination of patient, obtaining history from patient or surrogate, ordering and performing treatments and interventions, ordering and review of laboratory studies, ordering and review of radiographic studies, pulse oximetry and re-evaluation of patient's condition.   Gwyneth Sprout, MD 05/15/15 1220

## 2015-05-15 NOTE — ED Notes (Signed)
Critical Lactic Acid of 8.93 reported to Dr. Anitra LauthPlunkett. Sample rerun to verify.

## 2015-05-15 NOTE — ED Notes (Signed)
HP1 at bedside for transport. 

## 2015-05-15 NOTE — ED Notes (Signed)
On transfer Insulin no longer hanging due to that last BS - the patient and HP 1 for tranport aware

## 2015-05-15 NOTE — ED Notes (Signed)
Only 1 bag of K infused while here. HP 1 aware to have the RN at [facility] aware

## 2015-08-26 ENCOUNTER — Emergency Department (HOSPITAL_BASED_OUTPATIENT_CLINIC_OR_DEPARTMENT_OTHER): Payer: Self-pay

## 2015-08-26 ENCOUNTER — Encounter (HOSPITAL_BASED_OUTPATIENT_CLINIC_OR_DEPARTMENT_OTHER): Payer: Self-pay

## 2015-08-26 ENCOUNTER — Other Ambulatory Visit: Payer: Self-pay

## 2015-08-26 ENCOUNTER — Emergency Department (HOSPITAL_BASED_OUTPATIENT_CLINIC_OR_DEPARTMENT_OTHER)
Admission: EM | Admit: 2015-08-26 | Discharge: 2015-08-26 | Disposition: A | Discharge status: Home / Self Care | Payer: Self-pay | Attending: Emergency Medicine | Admitting: Emergency Medicine

## 2015-08-26 DIAGNOSIS — Z3202 Encounter for pregnancy test, result negative: Secondary | ICD-10-CM | POA: Insufficient documentation

## 2015-08-26 DIAGNOSIS — K2100 Gastro-esophageal reflux disease with esophagitis, without bleeding: Secondary | ICD-10-CM

## 2015-08-26 DIAGNOSIS — Z794 Long term (current) use of insulin: Secondary | ICD-10-CM | POA: Insufficient documentation

## 2015-08-26 DIAGNOSIS — R0789 Other chest pain: Secondary | ICD-10-CM | POA: Insufficient documentation

## 2015-08-26 DIAGNOSIS — E119 Type 2 diabetes mellitus without complications: Secondary | ICD-10-CM | POA: Insufficient documentation

## 2015-08-26 LAB — URINALYSIS, ROUTINE W REFLEX MICROSCOPIC
BILIRUBIN URINE: NEGATIVE
HGB URINE DIPSTICK: NEGATIVE
Ketones, ur: 15 mg/dL — AB
Leukocytes, UA: NEGATIVE
Nitrite: NEGATIVE
PROTEIN: 100 mg/dL — AB
Specific Gravity, Urine: 1.026 (ref 1.005–1.030)
pH: 5.5 (ref 5.0–8.0)

## 2015-08-26 LAB — CBC WITH DIFFERENTIAL/PLATELET
Basophils Absolute: 0 10*3/uL (ref 0.0–0.1)
Basophils Relative: 0 %
Eosinophils Absolute: 0.1 10*3/uL (ref 0.0–0.7)
Eosinophils Relative: 1 %
HEMATOCRIT: 36.1 % (ref 36.0–46.0)
HEMOGLOBIN: 11.8 g/dL — AB (ref 12.0–15.0)
LYMPHS ABS: 3.1 10*3/uL (ref 0.7–4.0)
LYMPHS PCT: 25 %
MCH: 25.1 pg — AB (ref 26.0–34.0)
MCHC: 32.7 g/dL (ref 30.0–36.0)
MCV: 76.6 fL — AB (ref 78.0–100.0)
Monocytes Absolute: 0.8 10*3/uL (ref 0.1–1.0)
Monocytes Relative: 6 %
NEUTROS ABS: 8.6 10*3/uL — AB (ref 1.7–7.7)
Neutrophils Relative %: 68 %
PLATELETS: 331 10*3/uL (ref 150–400)
RBC: 4.71 MIL/uL (ref 3.87–5.11)
RDW: 15.3 % (ref 11.5–15.5)
WBC: 12.6 10*3/uL — AB (ref 4.0–10.5)

## 2015-08-26 LAB — COMPREHENSIVE METABOLIC PANEL
ALK PHOS: 85 U/L (ref 38–126)
ALT: 29 U/L (ref 14–54)
AST: 37 U/L (ref 15–41)
Albumin: 3.6 g/dL (ref 3.5–5.0)
Anion gap: 14 (ref 5–15)
BILIRUBIN TOTAL: 0.8 mg/dL (ref 0.3–1.2)
BUN: 26 mg/dL — AB (ref 6–20)
CALCIUM: 8.4 mg/dL — AB (ref 8.9–10.3)
CHLORIDE: 100 mmol/L — AB (ref 101–111)
CO2: 20 mmol/L — ABNORMAL LOW (ref 22–32)
CREATININE: 1.11 mg/dL — AB (ref 0.44–1.00)
Glucose, Bld: 131 mg/dL — ABNORMAL HIGH (ref 65–99)
Potassium: 3.6 mmol/L (ref 3.5–5.1)
Sodium: 134 mmol/L — ABNORMAL LOW (ref 135–145)
Total Protein: 7.4 g/dL (ref 6.5–8.1)

## 2015-08-26 LAB — URINE MICROSCOPIC-ADD ON

## 2015-08-26 LAB — TROPONIN I: Troponin I: <0.03 ng/mL (ref <0.031)

## 2015-08-26 LAB — PREGNANCY, URINE: PREG TEST UR: NEGATIVE

## 2015-08-26 MED ORDER — RANITIDINE HCL 150 MG PO CAPS: 150.0000 mg | ORAL_CAPSULE | Freq: Every day | ORAL | Status: DC

## 2015-08-26 MED ORDER — ONDANSETRON 4 MG PO TBDP
4.0000 mg | ORAL_TABLET | Freq: Once | ORAL | Status: AC
  Administered 2015-08-26: 4 mg via ORAL
  Filled 2015-08-26: qty 1

## 2015-08-26 MED ORDER — SUCRALFATE 1 G PO TABS: 1.0000 g | ORAL_TABLET | Freq: Three times a day (TID) | ORAL | Status: DC

## 2015-08-26 MED ORDER — GI COCKTAIL ~~LOC~~
30.0000 mL | Freq: Once | ORAL | Status: AC
  Administered 2015-08-26: 30 mL via ORAL
  Filled 2015-08-26: qty 30

## 2015-08-26 MED FILL — SM ACID REDUCER 150 MG TAB: 150 MG | 65 days supply | Qty: 65 | Fill #0

## 2015-08-26 MED FILL — SUCRALFATE 1 GM TABLET: 1 | 8 days supply | Qty: 30 | Fill #0

## 2015-08-26 NOTE — ED Notes (Signed)
C/o CP since yesterday 

## 2015-08-26 NOTE — ED Provider Notes (Signed)
CSN: [id number]     Arrival date & time 08/26/15  1441 History   First MD Initiated Contact with Patient 08/26/15 1533     Chief Complaint  Patient presents with  . Chest Pain     (Consider location/radiation/quality/duration/timing/severity/associated sxs/prior Treatment) Patient is a 25 y.o. female presenting with chest pain. The history is provided by the patient.  Chest Pain Pain location:  L chest, substernal area and epigastric Pain quality: burning, pressure and sharp   Pain radiates to:  Does not radiate Pain radiates to the back: no   Pain severity:  Moderate Onset quality:  Gradual Duration:  2 days Timing:  Constant Progression:  Worsening Chronicity:  New Context: eating   Context comment:  Started spontaneously but much worse with eating Relieved by:  Nothing Exacerbated by: Eating. Ineffective treatments: Took a pink antacid pill last night but it did not help. Associated symptoms: heartburn, nausea and vomiting   Associated symptoms: no abdominal pain, no back pain, no cough, no diaphoresis, no dizziness, no fever, no palpitations and no shortness of breath   Risk factors: diabetes mellitus   Risk factors: no birth control, no high cholesterol, no hypertension, no immobilization, not obese, not pregnant, no prior DVT/PE, no smoking and no surgery     Past Medical History  Diagnosis Date  . Diabetes mellitus without complication [facility])    Past Surgical History  Procedure Laterality Date  . Incision and drainage breast abscess     No family history on file. Social History  Substance Use Topics  . Smoking status: Never Smoker   . Smokeless tobacco: None  . Alcohol Use: No   OB History    No data available     Review of Systems  Constitutional: Negative for fever and diaphoresis.  Respiratory: Negative for cough and shortness of breath.   Cardiovascular: Positive for chest pain. Negative for palpitations.  Gastrointestinal: Positive for heartburn,  nausea and vomiting. Negative for abdominal pain.  Musculoskeletal: Negative for back pain.  Neurological: Negative for dizziness.  All other systems reviewed and are negative.     Allergies  Review of patient's allergies indicates no known allergies.  Home Medications   Prior to Admission medications   Medication Sig Start Date End Date Taking? Authorizing Provider  acetaminophen (TYLENOL) 325 MG tablet Take 650 mg by mouth every 6 (six) hours as needed.    Historical Provider, MD  insulin aspart (NOVOLOG) 100 UNIT/ML injection Inject into the skin 3 (three) times daily before meals.    Historical Provider, MD  insulin detemir (LEVEMIR) 100 UNIT/ML injection Inject into the skin at bedtime.    Historical Provider, MD   BP 132/92 mmHg  Pulse 108  Temp(Src) 98.2 F (36.8 C) (Oral)  Resp 18  Ht 5\' 2"  (1.575 m)  Wt 125 lb (56.7 kg)  BMI 22.86 kg/m2  SpO2 100%  LMP 08/02/2015 Physical Exam  Constitutional: She is oriented to person, place, and time. She appears well-developed and well-nourished. No distress.  HENT:  Head: Normocephalic and atraumatic.  Mouth/Throat: Oropharynx is clear and moist.  Eyes: Conjunctivae and EOM are normal. Pupils are equal, round, and reactive to light.  Neck: Normal range of motion. Neck supple.  Cardiovascular: Normal rate, regular rhythm and intact distal pulses.   No murmur heard. Pulmonary/Chest: Effort normal and breath sounds normal. No respiratory distress. She has no wheezes. She has no rales. She exhibits no tenderness.  Abdominal: Soft. She exhibits no distension. There is no  tenderness. There is no rebound and no guarding.  Musculoskeletal: Normal range of motion. She exhibits no edema or tenderness.  Neurological: She is alert and oriented to person, place, and time.  Skin: Skin is warm and dry. No rash noted. No erythema.  Psychiatric: She has a normal mood and affect. Her behavior is normal.  Nursing note and vitals  reviewed.   ED Course  Procedures (including critical care time) Labs Review Labs Reviewed  CBC WITH DIFFERENTIAL/PLATELET - Abnormal; Notable for the following:    WBC 12.6 (*)    Hemoglobin 11.8 (*)    MCV 76.6 (*)    MCH 25.1 (*)    Neutro Abs 8.6 (*)    All other components within normal limits  COMPREHENSIVE METABOLIC PANEL - Abnormal; Notable for the following:    Sodium 134 (*)    Chloride 100 (*)    CO2 20 (*)    Glucose, Bld 131 (*)    BUN 26 (*)    Creatinine, Ser 1.11 (*)    Calcium 8.4 (*)    All other components within normal limits  URINALYSIS, ROUTINE W REFLEX MICROSCOPIC (NOT AT [facility]) - Abnormal; Notable for the following:    APPearance CLOUDY (*)    Glucose, UA >1000 (*)    Ketones, ur 15 (*)    Protein, ur 100 (*)    All other components within normal limits  URINE MICROSCOPIC-ADD ON - Abnormal; Notable for the following:    Squamous Epithelial / LPF 6-30 (*)    Bacteria, UA RARE (*)    All other components within normal limits  PREGNANCY, URINE  TROPONIN I    Imaging Review Dg Chest 2 View  08/26/2015  CLINICAL DATA:  Chest pain on the left side. EXAM: CHEST  2 VIEW COMPARISON:  03/20/2010 FINDINGS: Normal heart size and mediastinal contours. No acute infiltrate or edema. No effusion or pneumothorax. No acute osseous findings. IMPRESSION: Negative chest. Electronically Signed   By: Marnee Spring M.D.   On: 08/26/2015 15:51   I have personally reviewed and evaluated these images and lab results as part of my medical decision-making.   EKG Interpretation   Date/Time:  Tuesday August 26 2015 14:46:59 EST Ventricular Rate:  109 PR Interval:  116 QRS Duration: 80 QT Interval:  326 QTC Calculation: 439 R Axis:   94 Text Interpretation:  Sinus tachycardia Rightward axis Nonspecific T wave  abnormality Abnormal ECG since last tracing no significant change  Confirmed by BELFI  MD, MELANIE ([id number]) on 08/26/2015 2:53:00 PM      MDM   Final  diagnoses:  Atypical chest pain  Gastroesophageal reflux disease with esophagitis    Patient is a 89 are old female with a history of type 1 diabetes who currently is taking insulin presenting today with 2 days of chest pain which is most consistent with reflux. She has had several episodes of emesis and complains of a burning heartburn type sensation in her chest. She has no shortness of breath, diaphoresis, cough or fever with the symptoms. Low risk for PE. Patient has no family history of cardiac problems. EKG is unchanged. Patient has no reproducible abdominal pain and low suspicion for aortic dissection. We'll send labs to ensure patient is not in DKA today however her last blood sugar was in the 200s and she did take insulin at noon today. She does not smell like ketones. And low suspicion for pregnancy. Patient does not take anything regularly for GERD. She  was given Zofran and GI cocktail.  Chest x-ray was within normal limits. CBC with minimal leukocytosis of 12 and a UA with 15 tones without other abnormality. UPT negative. CMP with a normal anion gap without evidence of DKA at this time. After GI cocktail patient is feeling much better. Troponin is negative. Patient sent home with Zantac and Carafate.   Gwyneth Sprout, MD 08/26/15 1725

## 2015-12-06 ENCOUNTER — Encounter (HOSPITAL_BASED_OUTPATIENT_CLINIC_OR_DEPARTMENT_OTHER): Payer: Self-pay

## 2015-12-06 ENCOUNTER — Emergency Department (HOSPITAL_BASED_OUTPATIENT_CLINIC_OR_DEPARTMENT_OTHER)
Admission: EM | Admit: 2015-12-06 | Discharge: 2015-12-06 | Disposition: A | Discharge status: Home / Self Care | Payer: Self-pay | Attending: Emergency Medicine | Admitting: Emergency Medicine

## 2015-12-06 DIAGNOSIS — N390 Urinary tract infection, site not specified: Secondary | ICD-10-CM | POA: Insufficient documentation

## 2015-12-06 DIAGNOSIS — Z794 Long term (current) use of insulin: Secondary | ICD-10-CM | POA: Insufficient documentation

## 2015-12-06 DIAGNOSIS — E119 Type 2 diabetes mellitus without complications: Secondary | ICD-10-CM | POA: Insufficient documentation

## 2015-12-06 LAB — URINALYSIS, ROUTINE W REFLEX MICROSCOPIC
BILIRUBIN URINE: NEGATIVE
Glucose, UA: >1000 mg/dL — AB
HGB URINE DIPSTICK: NEGATIVE
KETONES UR: NEGATIVE mg/dL
Nitrite: NEGATIVE
PH: 6 (ref 5.0–8.0)
Protein, ur: 30 mg/dL — AB
SPECIFIC GRAVITY, URINE: 1.027 (ref 1.005–1.030)

## 2015-12-06 LAB — URINE MICROSCOPIC-ADD ON: RBC / HPF: NONE SEEN RBC/hpf (ref 0–5)

## 2015-12-06 LAB — PREGNANCY, URINE: Preg Test, Ur: NEGATIVE

## 2015-12-06 MED ORDER — NITROFURANTOIN MONOHYD MACRO 100 MG PO CAPS: 100.0000 mg | ORAL_CAPSULE | Freq: Two times a day (BID) | ORAL | Status: DC

## 2015-12-06 NOTE — ED Notes (Signed)
Pt verbalizes understanding of d/c instructions and denies any further needs at this time. 

## 2015-12-06 NOTE — Discharge Instructions (Signed)
Urinary Tract Infection Tricia Boone, take antibiotics as directed and see a primary care physician within 3 days for close follow up. If any symptoms worsen, come back to the ED immediately. Thank you. A urinary tract infection (UTI) can occur any place along the urinary tract. The tract includes the kidneys, ureters, bladder, and urethra. A type of germ called bacteria often causes a UTI. UTIs are often helped with antibiotic medicine.  HOME CARE   If given, take antibiotics as told by your doctor. Finish them even if you start to feel better.  Drink enough fluids to keep your pee (urine) clear or pale yellow.  Avoid tea, drinks with caffeine, and bubbly (carbonated) drinks.  Pee often. Avoid holding your pee in for a long time.  Pee before and after having sex (intercourse).  Wipe from front to back after you poop (bowel movement) if you are a woman. Use each tissue only once. GET HELP RIGHT AWAY IF:   You have back pain.  You have lower belly (abdominal) pain.  You have chills.  You feel sick to your stomach (nauseous).  You throw up (vomit).  Your burning or discomfort with peeing does not go away.  You have a fever.  Your symptoms are not better in 3 days. MAKE SURE YOU:   Understand these instructions.  Will watch your condition.  Will get help right away if you are not doing well or get worse.   This information is not intended to replace advice given to you by your health care provider. Make sure you discuss any questions you have with your health care provider.   Document Released: 12/08/2007 Document Revised: 07/12/2014 Document Reviewed: 01/20/2012 Elsevier Interactive Patient Education [employer]2016 [employer] Inc[employer]

## 2015-12-06 NOTE — ED Provider Notes (Signed)
CSN: [id number]     Arrival date & time 12/06/15  0349 History   First MD Initiated Contact with Patient 12/06/15 0406     Chief Complaint  Patient presents with  . Dysuria     (Consider location/radiation/quality/duration/timing/severity/associated sxs/prior Treatment) HPI Tricia Boone is a 25 y.o. female with PMH of DM, here with dysuria x 2 days.  She denies history of UTI. She denies hematuria, irreg vag bleeding, irreg vag discharge.  She has no abdominal pain, N/V/D.  She has never had these symptoms in the past.  She thought it would pass but has continued to hurt even more with urination. She keeps her BS under good control.  There are no further complaints.  10 Systems reviewed and are negative for acute change except as noted in the HPI.    Past Medical History  Diagnosis Date  . Diabetes mellitus without complication [facility])    Past Surgical History  Procedure Laterality Date  . Incision and drainage breast abscess     No family history on file. Social History  Substance Use Topics  . Smoking status: Never Smoker   . Smokeless tobacco: None  . Alcohol Use: No   OB History    No data available     Review of Systems    Allergies  Review of patient's allergies indicates no known allergies.  Home Medications   Prior to Admission medications   Medication Sig Start Date End Date Taking? Authorizing Provider  acetaminophen (TYLENOL) 325 MG tablet Take 650 mg by mouth every 6 (six) hours as needed.    Historical Provider, MD  insulin aspart (NOVOLOG) 100 UNIT/ML injection Inject into the skin 3 (three) times daily before meals.    Historical Provider, MD  insulin detemir (LEVEMIR) 100 UNIT/ML injection Inject into the skin at bedtime.    Historical Provider, MD  ranitidine (ZANTAC) 150 MG capsule Take 1 capsule (150 mg total) by mouth daily. 08/26/15   Gwyneth Sprout, MD  sucralfate (CARAFATE) 1 g tablet Take 1 tablet (1 g total) by mouth 4 (four) times daily  -  with meals and at bedtime. 08/26/15   Gwyneth Sprout, MD   BP 131/92 mmHg  Pulse 96  Temp(Src) 99 F (37.2 C) (Oral)  Resp 18  Ht  (1.575 m)  Wt 123 lb (55.792 kg)  BMI 22.49 kg/m2  SpO2 100%  LMP 11/15/2015 Physical Exam  Constitutional: She is oriented to person, place, and time. She appears well-developed and well-nourished. No distress.  HENT:  Head: Normocephalic and atraumatic.  Nose: Nose normal.  Mouth/Throat: Oropharynx is clear and moist. No oropharyngeal exudate.  Eyes: Conjunctivae and EOM are normal. Pupils are equal, round, and reactive to light. No scleral icterus.  Neck: Normal range of motion. Neck supple. No JVD present. No tracheal deviation present. No thyromegaly present.  Cardiovascular: Normal rate, regular rhythm and normal heart sounds.  Exam reveals no gallop and no friction rub.   No murmur heard. Pulmonary/Chest: Effort normal and breath sounds normal. No respiratory distress. She has no wheezes. She exhibits no tenderness.  Abdominal: Soft. Bowel sounds are normal. She exhibits no distension and no mass. There is no tenderness. There is no rebound and no guarding.  Musculoskeletal: Normal range of motion. She exhibits no edema or tenderness.  Lymphadenopathy:    She has no cervical adenopathy.  Neurological: She is alert and oriented to person, place, and time. No cranial nerve deficit. She exhibits normal muscle tone.  Skin: Skin is warm and dry. No rash noted. No erythema. No pallor.  Nursing note and vitals reviewed.   ED Course  Procedures (including critical care time) Labs Review Labs Reviewed  URINALYSIS, ROUTINE W REFLEX MICROSCOPIC (NOT AT [facility]) - Abnormal; Notable for the following:    APPearance CLOUDY (*)    Glucose, UA >1000 (*)    Protein, ur 30 (*)    Leukocytes, UA SMALL (*)    All other components within normal limits  URINE MICROSCOPIC-ADD ON - Abnormal; Notable for the following:    Squamous Epithelial / LPF 6-30 (*)     Bacteria, UA FEW (*)    All other components within normal limits  PREGNANCY, URINE    Imaging Review No results found. I have personally reviewed and evaluated these images and lab results as part of my medical decision-making.   EKG Interpretation None      MDM   Final diagnoses:  None    Patient presents to the ED for dysuria.  UA and U preg are pending.  UA does not show overwhelming infection, but patient is rather symptomatic.  Culture sent and will treat with 3 days of macrobid. PCP fu advised.  Patient appears well and in NAD.  VS remain within her normal limits and she is safe for DC.    Tricia CrumbleAdeleke Riti Rollyson, MD 12/06/15 [phone]

## 2015-12-06 NOTE — ED Notes (Signed)
Pt c/o painful urination for the last two days, no flank pain, no fevers, vaginal discharge.  Frequency of urination with minimal output occasionally.

## 2015-12-07 LAB — URINE CULTURE

## 2015-12-08 MED FILL — NITROFURANTOIN MONO-MCR 100: 100 | 3 days supply | Qty: 6 | Fill #0

## 2016-01-09 ENCOUNTER — Emergency Department (HOSPITAL_BASED_OUTPATIENT_CLINIC_OR_DEPARTMENT_OTHER)
Admission: EM | Admit: 2016-01-09 | Discharge: 2016-01-09 | Disposition: A | Discharge status: Home / Self Care | Payer: Self-pay | Attending: Emergency Medicine | Admitting: Emergency Medicine

## 2016-01-09 ENCOUNTER — Encounter (HOSPITAL_BASED_OUTPATIENT_CLINIC_OR_DEPARTMENT_OTHER): Payer: Self-pay | Admitting: *Deleted

## 2016-01-09 DIAGNOSIS — E119 Type 2 diabetes mellitus without complications: Secondary | ICD-10-CM | POA: Insufficient documentation

## 2016-01-09 DIAGNOSIS — N390 Urinary tract infection, site not specified: Secondary | ICD-10-CM

## 2016-01-09 LAB — URINE MICROSCOPIC-ADD ON

## 2016-01-09 LAB — URINALYSIS, ROUTINE W REFLEX MICROSCOPIC
Glucose, UA: NEGATIVE mg/dL
KETONES UR: 15 mg/dL — AB
NITRITE: NEGATIVE
PH: 6 (ref 5.0–8.0)
Protein, ur: >300 mg/dL — AB
Specific Gravity, Urine: 1.023 (ref 1.005–1.030)

## 2016-01-09 LAB — PREGNANCY, URINE: Preg Test, Ur: NEGATIVE

## 2016-01-09 MED ORDER — NITROFURANTOIN MONOHYD MACRO 100 MG PO CAPS: 100.0000 mg | ORAL_CAPSULE | Freq: Two times a day (BID) | ORAL | Status: DC

## 2016-01-09 MED ORDER — NITROFURANTOIN MONOHYD MACRO 100 MG PO CAPS
100.0000 mg | ORAL_CAPSULE | Freq: Once | ORAL | Status: AC
  Administered 2016-01-09: 100 mg via ORAL
  Filled 2016-01-09: qty 1

## 2016-01-09 MED ORDER — PHENAZOPYRIDINE HCL 100 MG PO TABS
95.0000 mg | ORAL_TABLET | Freq: Once | ORAL | Status: AC
  Administered 2016-01-09: 100 mg via ORAL
  Filled 2016-01-09: qty 1

## 2016-01-09 MED ORDER — PHENAZOPYRIDINE HCL 200 MG PO TABS: 200.0000 mg | ORAL_TABLET | Freq: Three times a day (TID) | ORAL | Status: DC

## 2016-01-09 MED FILL — NITROFURANTOIN MONO-MCR 100: 100 | 7 days supply | Qty: 14 | Fill #0

## 2016-01-09 MED FILL — PHENAZOPYRIDINE 200 MG TAB: 200 | 2 days supply | Qty: 6 | Fill #0

## 2016-01-09 NOTE — ED Notes (Signed)
Pt c/o painful urination x 12 hrs , denies vaginal discharge

## 2016-01-09 NOTE — ED Provider Notes (Signed)
CSN: [id number]     Arrival date & time 01/09/16  0029 History   First MD Initiated Contact with Patient 01/09/16 0059     Chief Complaint  Patient presents with  . Dysuria     (Consider location/radiation/quality/duration/timing/severity/associated sxs/prior Treatment) HPI  This is a 25 year old female who complains of pain with urination for about the past 12 hours. Symptoms are moderate. She does not describe the pain as a burning sensation. She denies fever, chills, nausea, vomiting, diarrhea, vaginal bleeding or vaginal discharge.  Past Medical History  Diagnosis Date  . Diabetes mellitus without complication [facility](HCC)    Past Surgical History  Procedure Laterality Date  . Incision and drainage breast abscess     History reviewed. No pertinent family history. Social History  Substance Use Topics  . Smoking status: Never Smoker   . Smokeless tobacco: None  . Alcohol Use: No   OB History    No data available     Review of Systems  All other systems reviewed and are negative.   Allergies  Review of patient's allergies indicates no known allergies.  Home Medications   Prior to Admission medications   Medication Sig Start Date End Date Taking? Authorizing Provider  acetaminophen (TYLENOL) 325 MG tablet Take 650 mg by mouth every 6 (six) hours as needed.    Historical Provider, MD  insulin aspart (NOVOLOG) 100 UNIT/ML injection Inject into the skin 3 (three) times daily before meals.    Historical Provider, MD  insulin detemir (LEVEMIR) 100 UNIT/ML injection Inject into the skin at bedtime.    Historical Provider, MD  sucralfate (CARAFATE) 1 g tablet Take 1 tablet (1 g total) by mouth 4 (four) times daily -  with meals and at bedtime. 08/26/15   Gwyneth SproutWhitney Plunkett, MD   BP 121/92 mmHg  Pulse 102  Temp(Src) 98.4 F (36.9 C) (Oral)  Resp 18  Ht 5\' 2"  (1.575 m)  Wt 128 lb 4.8 oz (58.196 kg)  BMI 23.46 kg/m2  SpO2 100%  LMP 12/20/2015   Physical Exam  General:  Well-developed, well-nourished female in no acute distress; appearance consistent with age of record HENT: normocephalic; atraumatic Eyes: pupils equal, round and reactive to light; extraocular muscles intact Neck: supple Heart: regular rate and rhythm Lungs: clear to auscultation bilaterally Abdomen: soft; nondistended; mild suprapubic tenderness; no masses or hepatosplenomegaly; bowel sounds present Extremities: No deformity; full range of motion; pulses normal Neurologic: Awake, alert and oriented; motor function intact in all extremities and symmetric; no facial droop Skin: Warm and dry Psychiatric: Normal mood and affect    ED Course  Procedures (including critical care time)   MDM  Nursing notes and vitals signs, including pulse oximetry, reviewed.  Summary of this visit's results, reviewed by myself:  Labs:  Results for orders placed or performed during the hospital encounter of 01/09/16 (from the past 24 hour(s))  Urinalysis, Routine w reflex microscopic (not at [facility])     Status: Abnormal   Collection Time: 01/09/16 12:37 AM  Result Value Ref Range   Color, Urine AMBER (A) YELLOW   APPearance TURBID (A) CLEAR   Specific Gravity, Urine 1.023 1.005 - 1.030   pH 6.0 5.0 - 8.0   Glucose, UA NEGATIVE NEGATIVE mg/dL   Hgb urine dipstick LARGE (A) NEGATIVE   Bilirubin Urine SMALL (A) NEGATIVE   Ketones, ur 15 (A) NEGATIVE mg/dL   Protein, ur >409>300 (A) NEGATIVE mg/dL   Nitrite NEGATIVE NEGATIVE   Leukocytes, UA LARGE (A) NEGATIVE  Pregnancy, urine     Status: None   Collection Time: 01/09/16 12:37 AM  Result Value Ref Range   Preg Test, Ur NEGATIVE NEGATIVE  Urine microscopic-add on     Status: Abnormal   Collection Time: 01/09/16 12:37 AM  Result Value Ref Range   Squamous Epithelial / LPF 0-5 (A) NONE SEEN   WBC, UA TOO NUMEROUS TO COUNT 0 - 5 WBC/hpf   RBC / HPF TOO NUMEROUS TO COUNT 0 - 5 RBC/hpf   Bacteria, UA MANY (A) NONE SEEN        Paula LibraJohn Carlota Philley,  MD 01/09/16 [id number]

## 2016-01-09 NOTE — ED Notes (Signed)
Pt verbalizes understanding of d/c instructions and denies any further needs at this time. 

## 2016-01-11 LAB — URINE CULTURE

## 2016-01-12 ENCOUNTER — Telehealth (HOSPITAL_BASED_OUTPATIENT_CLINIC_OR_DEPARTMENT_OTHER): Payer: Self-pay | Admitting: Emergency Medicine

## 2016-01-12 NOTE — Telephone Encounter (Signed)
Post ED Visit - Positive Culture Follow-up  Culture report reviewed by antimicrobial stewardship pharmacist:  []  Enzo BiNathan Batchelder, Pharm.D. []  Celedonio MiyamotoJeremy Frens, Pharm.D., BCPS [x]  Garvin FilaMike Maccia, Pharm.D. []  Georgina PillionElizabeth Martin, Pharm.D., BCPS []  GaribaldiMinh Pham, [address]Ph[address]D., BCPS, AAHIVP []  Estella HuskMichelle Turner, Pharm.D., BCPS, AAHIVP []  Tennis Mustassie Stewart, [address]Pharm[address]D. []  Sherle Poeob Vincent, [place]arm[place]D.  Positive urine culture Treated with nitrofurantoin, organism sensitive to the same and no further patient follow-up is required at this time.  Berle MullMiller, Laurrie Toppin 01/12/2016, 10:05 AM

## 2016-01-25 ENCOUNTER — Emergency Department (HOSPITAL_BASED_OUTPATIENT_CLINIC_OR_DEPARTMENT_OTHER)
Admission: EM | Admit: 2016-01-25 | Discharge: 2016-01-25 | Disposition: A | Discharge status: Home / Self Care | Payer: Self-pay | Attending: Emergency Medicine | Admitting: Emergency Medicine

## 2016-01-25 ENCOUNTER — Ambulatory Visit (HOSPITAL_BASED_OUTPATIENT_CLINIC_OR_DEPARTMENT_OTHER)
Admission: RE | Admit: 2016-01-25 | Discharge: 2016-01-25 | Disposition: A | Discharge status: Home / Self Care | Payer: Self-pay | Source: Ambulatory Visit | Attending: Emergency Medicine | Admitting: Emergency Medicine

## 2016-01-25 ENCOUNTER — Other Ambulatory Visit (HOSPITAL_BASED_OUTPATIENT_CLINIC_OR_DEPARTMENT_OTHER): Payer: Self-pay | Admitting: Emergency Medicine

## 2016-01-25 DIAGNOSIS — R52 Pain, unspecified: Secondary | ICD-10-CM

## 2016-01-25 DIAGNOSIS — W228XXA Striking against or struck by other objects, initial encounter: Secondary | ICD-10-CM | POA: Insufficient documentation

## 2016-01-25 DIAGNOSIS — T1490XA Injury, unspecified, initial encounter: Secondary | ICD-10-CM

## 2016-01-25 DIAGNOSIS — E1065 Type 1 diabetes mellitus with hyperglycemia: Secondary | ICD-10-CM | POA: Insufficient documentation

## 2016-01-25 DIAGNOSIS — T1490 Injury, unspecified: Secondary | ICD-10-CM | POA: Insufficient documentation

## 2016-01-25 DIAGNOSIS — Y9301 Activity, walking, marching and hiking: Secondary | ICD-10-CM | POA: Insufficient documentation

## 2016-01-25 DIAGNOSIS — Y999 Unspecified external cause status: Secondary | ICD-10-CM | POA: Insufficient documentation

## 2016-01-25 DIAGNOSIS — X58XXXA Exposure to other specified factors, initial encounter: Secondary | ICD-10-CM | POA: Insufficient documentation

## 2016-01-25 DIAGNOSIS — Z794 Long term (current) use of insulin: Secondary | ICD-10-CM | POA: Insufficient documentation

## 2016-01-25 DIAGNOSIS — R739 Hyperglycemia, unspecified: Secondary | ICD-10-CM

## 2016-01-25 DIAGNOSIS — S60222A Contusion of left hand, initial encounter: Secondary | ICD-10-CM

## 2016-01-25 DIAGNOSIS — Y92019 Unspecified place in single-family (private) house as the place of occurrence of the external cause: Secondary | ICD-10-CM | POA: Insufficient documentation

## 2016-01-25 NOTE — ED Provider Notes (Signed)
[facility] Note   CSN: [id number] Arrival date & time: 01/25/16  0055  First Provider Contact:  None       History   Chief Complaint No chief complaint on file.   Tricia Boone is a 25 y.o. female.  Patient is a 25 year old female with history of type 1 diabetes. She presents for evaluation of hand pain. She reports she struck it on her dogs cage while walking through her house. She denies any fevers or chills. She denies any frequent urination or excessive thirst.   The history is provided by the patient.    Past Medical History:  Diagnosis Date  . Diabetes mellitus without complication (HCC)     There are no active problems to display for this patient.   Past Surgical History:  Procedure Laterality Date  . INCISION AND DRAINAGE BREAST ABSCESS      OB History    No data available       Home Medications    Prior to Admission medications   Medication Sig Start Date End Date Taking? Authorizing Provider  acetaminophen (TYLENOL) 325 MG tablet Take 650 mg by mouth every 6 (six) hours as needed.    Historical Provider, MD  insulin aspart (NOVOLOG) 100 UNIT/ML injection Inject into the skin 3 (three) times daily before meals.    Historical Provider, MD  insulin detemir (LEVEMIR) 100 UNIT/ML injection Inject into the skin at bedtime.    Historical Provider, MD  nitrofurantoin, macrocrystal-monohydrate, (MACROBID) 100 MG capsule Take 1 capsule (100 mg total) by mouth 2 (two) times daily. X 7 days 01/09/16   Paula Libra, MD  phenazopyridine (PYRIDIUM) 200 MG tablet Take 1 tablet (200 mg total) by mouth 3 (three) times daily. 01/09/16   John Molpus, MD  sucralfate (CARAFATE) 1 g tablet Take 1 tablet (1 g total) by mouth 4 (four) times daily -  with meals and at bedtime. 08/26/15   Gwyneth Sprout, MD    Family History No family history on file.  Social History Social History  Substance Use Topics  . Smoking status: Never Smoker  .  Smokeless tobacco: Not on file  . Alcohol use No     Allergies   Review of patient's allergies indicates no known allergies.   Review of Systems Review of Systems  All other systems reviewed and are negative.    Physical Exam Updated Vital Signs LMP 12/20/2015   Physical Exam  Constitutional: She is oriented to person, place, and time. She appears well-developed and well-nourished. No distress.  HENT:  Head: Normocephalic and atraumatic.  Neck: Normal range of motion. Neck supple.  Cardiovascular: Normal rate and regular rhythm.   No murmur heard. Pulmonary/Chest: Effort normal and breath sounds normal. No respiratory distress. She has no wheezes. She has no rales.  Abdominal: Soft. She exhibits no distension.  Musculoskeletal:  The left hand appears grossly normal. There is tenderness over the thenar eminence and pain with range of motion of the left thumb. There is no evidence for ulnar collateral ligament instability. She has good range of motion of the wrist. Distal capillary refill and sensation are intact.  Neurological: She is alert and oriented to person, place, and time.  Skin: Skin is warm and dry. She is not diaphoretic.  Nursing note and vitals reviewed.    ED Treatments / Results  Labs (all labs ordered are listed, but only abnormal results are displayed) Labs Reviewed - No data to display  EKG  EKG Interpretation None       Radiology No results found.  Procedures Procedures (including critical care time)  Medications Ordered in ED Medications - No data to display   Initial Impression / Assessment and Plan / ED Course  I have reviewed the triage vital signs and the nursing notes.  Pertinent labs & imaging results that were available during my care of the patient were reviewed by me and considered in my medical decision making (see chart for details).  Clinical Course    X-rays are negative for fracture. She will be placed in a soft splint  for comfort and advised to rest, ice, and take ibuprofen. Patient did have her blood sugar checked by the triage nurse and was found to be 500. The patient has no symptoms and appears well. She tells me she forgot to take her insulin this evening after she ate a large quantity of pizza and sweets and fell asleep. Again she has no symptoms that prompted the checking of this blood sugar I believe this to be an incidental finding. She was given her usual diabetic meds and advised to follow her sugars at home. She is to return as needed for any problems.  Final Clinical Impressions(s) / ED Diagnoses   Final diagnoses:  Hand contusion, left, initial encounter  Hyperglycemia    New Prescriptions New Prescriptions   No medications on file     Geoffery Lyons, MD 01/25/16 [phone]

## 2016-01-25 NOTE — ED Notes (Signed)
See paper charting for patient documentation. Pt d/c home at 0350. Pain score of 6/10.

## 2016-01-26 LAB — CBG MONITORING, ED: Glucose-Capillary: 510 mg/dL (ref 65–99)

## 2016-04-30 ENCOUNTER — Emergency Department (HOSPITAL_BASED_OUTPATIENT_CLINIC_OR_DEPARTMENT_OTHER)
Admission: EM | Admit: 2016-04-30 | Discharge: 2016-04-30 | Disposition: A | Discharge status: Home / Self Care | Payer: Self-pay | Attending: Emergency Medicine | Admitting: Emergency Medicine

## 2016-04-30 ENCOUNTER — Emergency Department (HOSPITAL_BASED_OUTPATIENT_CLINIC_OR_DEPARTMENT_OTHER): Payer: Self-pay

## 2016-04-30 ENCOUNTER — Encounter (HOSPITAL_BASED_OUTPATIENT_CLINIC_OR_DEPARTMENT_OTHER): Payer: Self-pay

## 2016-04-30 DIAGNOSIS — Y999 Unspecified external cause status: Secondary | ICD-10-CM | POA: Insufficient documentation

## 2016-04-30 DIAGNOSIS — W228XXA Striking against or struck by other objects, initial encounter: Secondary | ICD-10-CM | POA: Insufficient documentation

## 2016-04-30 DIAGNOSIS — Y929 Unspecified place or not applicable: Secondary | ICD-10-CM | POA: Insufficient documentation

## 2016-04-30 DIAGNOSIS — Z794 Long term (current) use of insulin: Secondary | ICD-10-CM | POA: Insufficient documentation

## 2016-04-30 DIAGNOSIS — S60022A Contusion of left index finger without damage to nail, initial encounter: Secondary | ICD-10-CM | POA: Insufficient documentation

## 2016-04-30 DIAGNOSIS — E119 Type 2 diabetes mellitus without complications: Secondary | ICD-10-CM | POA: Insufficient documentation

## 2016-04-30 DIAGNOSIS — Y939 Activity, unspecified: Secondary | ICD-10-CM | POA: Insufficient documentation

## 2016-04-30 DIAGNOSIS — S60419A Abrasion of unspecified finger, initial encounter: Secondary | ICD-10-CM

## 2016-04-30 DIAGNOSIS — Z79899 Other long term (current) drug therapy: Secondary | ICD-10-CM | POA: Insufficient documentation

## 2016-04-30 DIAGNOSIS — D849 Immunodeficiency, unspecified: Secondary | ICD-10-CM | POA: Insufficient documentation

## 2016-04-30 DIAGNOSIS — Z23 Encounter for immunization: Secondary | ICD-10-CM | POA: Insufficient documentation

## 2016-04-30 HISTORY — DX: Pure hypercholesterolemia, unspecified: E78.00

## 2016-04-30 MED ORDER — TETANUS-DIPHTH-ACELL PERTUSSIS 5-2.5-18.5 LF-MCG/0.5 IM SUSP
0.5000 mL | Freq: Once | INTRAMUSCULAR | Status: AC
  Administered 2016-04-30: 0.5 mL via INTRAMUSCULAR
  Filled 2016-04-30: qty 0.5

## 2016-04-30 MED ORDER — SULFAMETHOXAZOLE-TRIMETHOPRIM 800-160 MG PO TABS: 1.0000 | ORAL_TABLET | Freq: Two times a day (BID) | ORAL | 0 refills | Status: DC

## 2016-04-30 NOTE — ED Provider Notes (Signed)
[facility] Note   CSN: [id number] Arrival date & time: 04/30/16  1647  By signing my name below, I, Tricia Boone, attest that this documentation has been prepared under the direction and in the presence of  [name], PA-C. Electronically Signed: Christy Boone, ED Scribe. 04/30/16. 5:34 PM.  History   Chief Complaint Chief Complaint  Patient presents with  . Finger Injury   The history is provided by the patient and medical records. No language interpreter was used.  Hand Pain  This is a new problem. The current episode started more than 1 week ago. The problem occurs constantly. The problem has not changed since onset.Pertinent negatives include no chest pain, no abdominal pain and no shortness of breath. Exacerbated by: palpation of finger. The symptoms are relieved by NSAIDs. Treatments tried: ibuprofen' The treatment provided mild relief.     HPI Comments:  Tricia Boone is a 25 y.o. female with a PMHx of DM2, who presents to the Emergency Department complaining of pain and swelling in her left index finger.  She describes her pain as a 5/10 constant throbbing L index finger pain that radiates through her hand worse when bending and palpapting the finger and slightly relieved by ibuprofen. Pt states that  2 weeks ago pt accidentally hit her finger on something and it cut her finger, the area scabbed over and wasn't causing much trouble until today when she noted it was swollen and started to have some drainage. She denies redness, warmth, red streaking, fever, chills, chest pain, SOB, abd pain, nausea, vomiting, diarrhea, constipation, dysuria, hematuria, numbness, tingling, or focal weakness. She is unsure of her last tetanus.    Past Medical History:  Diagnosis Date  . Diabetes mellitus without complication (HCC)   . High cholesterol     There are no active problems to display for this patient.   Past Surgical History:    Procedure Laterality Date  . INCISION AND DRAINAGE BREAST ABSCESS      OB History    No data available       Home Medications    Prior to Admission medications   Medication Sig Start Date End Date Taking? Authorizing Provider  lisinopril (PRINIVIL,ZESTRIL) 5 MG tablet Take 5 mg by mouth daily.   Yes Historical Provider, MD  PRAVASTATIN SODIUM PO Take by mouth.   Yes Historical Provider, MD  acetaminophen (TYLENOL) 325 MG tablet Take 650 mg by mouth every 6 (six) hours as needed.    Historical Provider, MD  insulin aspart (NOVOLOG) 100 UNIT/ML injection Inject into the skin 3 (three) times daily before meals.    Historical Provider, MD  insulin detemir (LEVEMIR) 100 UNIT/ML injection Inject into the skin at bedtime.    Historical Provider, MD    Family History No family history on file.  Social History Social History  Substance Use Topics  . Smoking status: Never Smoker  . Smokeless tobacco: Never Used  . Alcohol use No     Allergies   Review of patient's allergies indicates no known allergies.   Review of Systems Review of Systems  Constitutional: Negative for chills and fever.  Respiratory: Negative for shortness of breath.   Cardiovascular: Negative for chest pain.  Gastrointestinal: Negative for abdominal pain, constipation, diarrhea, nausea and vomiting.  Genitourinary: Negative for dysuria and hematuria.  Musculoskeletal: Positive for arthralgias and joint swelling. Negative for myalgias.  Skin: Positive for wound. Negative for color change.  Allergic/Immunologic: Positive for immunocompromised state (DM2).  Neurological: Negative for weakness and numbness.  Psychiatric/Behavioral: Negative for confusion.  10 Systems reviewed and are negative for acute change except as noted in the HPI.    Physical Exam Updated Vital Signs BP (!) 140/116 (BP Location: Left Arm)   Pulse 104   Temp 98.2 F (36.8 C) (Oral)   Resp 18   Ht 5\' 2"  (1.575 m)   Wt 135 lb  (61.2 kg)   LMP 04/08/2016   SpO2 100%   BMI 24.69 kg/m   Physical Exam  Constitutional: She is oriented to person, place, and time. Vital signs are normal. She appears well-developed and well-nourished.  Non-toxic appearance. No distress.  Afebrile, nontoxic, NAD  HENT:  Head: Normocephalic and atraumatic.  Mouth/Throat: Mucous membranes are normal.  Eyes: Conjunctivae and EOM are normal. Right eye exhibits no discharge. Left eye exhibits no discharge.  Neck: Normal range of motion. Neck supple.  Cardiovascular: Normal rate and intact distal pulses.   Pulmonary/Chest: Effort normal. No respiratory distress.  Abdominal: Normal appearance. She exhibits no distension.  Musculoskeletal:       Left hand: She exhibits decreased range of motion (due to pain), tenderness, bony tenderness, laceration (scab) and swelling. She exhibits normal capillary refill and no deformity. Normal sensation noted. Normal strength noted.  Left index finger with mildly limited ROM at the PIP joint due to pain, FROM intact at MCP and DIP joints, with mild swelling over the PIP joint, with mild TTP to this area, and with a scab noted to the doral aspect of the PIP joint, no erythma or warmth, no drainage, no induration/fluctuance, no crepitus or deformity, cap refill brisk and present, sensation and strength grossly intact, soft compartments.  Neurological: She is alert and oriented to person, place, and time. She has normal strength. No sensory deficit.  Skin: Skin is warm, dry and intact. No rash noted.  Psychiatric: She has a normal mood and affect. Her behavior is normal.  Nursing note and vitals reviewed.    ED Treatments / Results   DIAGNOSTIC STUDIES:  Oxygen Saturation is 100% on RA, NML by my interpretation.    COORDINATION OF CARE:  5:27 PM Discussed treatment plan with pt at bedside and pt agreed to plan.  Labs (all labs ordered are listed, but only abnormal results are displayed) Labs Reviewed  - No data to display  EKG  EKG Interpretation None       Radiology Dg Hand Complete Left  Result Date: 04/30/2016 CLINICAL DATA:  Pain in left index finger. Hit finger in kitchen 2 days ago. EXAM: LEFT HAND - COMPLETE 3+ VIEW COMPARISON:  Left hand radiograph 01/25/2016 FINDINGS: Soft tissue swelling is present at the PIP joint. There is no underlying fracture or dislocation. No radiopaque foreign body is present. IMPRESSION: Soft tissue swelling about the index finger without underlying fracture or radiopaque foreign body. Electronically Signed   By: Marin Robertshristopher  Mattern M.D.   On: 04/30/2016 17:40    Procedures Procedures (including critical care time)  Medications Ordered in ED Medications  Tdap (BOOSTRIX) injection 0.5 mL (0.5 mLs Intramuscular Given 04/30/16 1721)     Initial Impression / Assessment and Plan / ED Course  I have reviewed the triage vital signs and the nursing notes.  Pertinent labs & imaging results that were available during my care of the patient were reviewed by me and considered in my medical decision making (see chart for details).  Clinical Course    25 y.o. female here with L  index finger injury 2wks ago, had a small scab that's been healing ever since, but today it started draining and became more painful. Small scab intact over PIP joint. FROM intact at all joints but slightly limited due to pain at PIP joint. Mild swelling, no warmth or erythema. Pt diabetic, could be slow healing due to this; could represent early infection; will likely tx with abx. Will get xray to ensure no underlying fx or osteomyelitis. Pt declines pain meds. Tdap updated. Will reassess shortly  6:09 PM Xray neg for underlying fx/dislocation or osteomyelitis findings. Will start on abx to cover for infection, given that she's diabetic and states it had drainage today-- although exam not with significant cellulitis or abscess, no need for I&D at this time. Discussed  RICE/tylenol/motrin. F/up with PCP in 2-3 days for wound recheck. I explained the diagnosis and have given explicit precautions to return to the ER including for any other new or worsening symptoms. The patient understands and accepts the medical plan as it's been dictated and I have answered their questions. Discharge instructions concerning home care and prescriptions have been given. The patient is STABLE and is discharged to home in good condition.   Final Clinical Impressions(s) / ED Diagnoses   Final diagnoses:  Contusion of left index finger without damage to nail, initial encounter  Abrasion of finger, initial encounter    New Prescriptions New Prescriptions   SULFAMETHOXAZOLE-TRIMETHOPRIM (BACTRIM DS,SEPTRA DS) 800-160 MG TABLET    Take 1 tablet by mouth 2 (two) times daily.   I personally performed the services described in this documentation, which was scribed in my presence. The recorded information has been reviewed and is accurate.    Allen Derry, PA-C 04/30/16 1809    Laurence Spates, MD 05/05/16 1640

## 2016-04-30 NOTE — Discharge Instructions (Signed)
Keep wound clean and dry. Apply warm compresses to affected area throughout the day. Take antibiotic until it is finished. Take tylenol and motrin as needed for pain. [facility]3 days for wound recheck. Monitor area for signs of infection to include, but not limited to: increasing pain, spreading redness, drainage/pus, worsening swelling, or fevers. Return to emergency department for emergent changing or worsening symptoms.

## 2016-04-30 NOTE — ED Triage Notes (Signed)
C/o "hit finger on something" 2 weeks ago-swelling noted to left index finger-NAD-steady gait

## 2016-05-03 MED FILL — SULFAMETHOXAZOLE-TMP DS TAB: 800-160 | 7 days supply | Qty: 14 | Fill #0

## 2016-05-13 ENCOUNTER — Encounter (HOSPITAL_BASED_OUTPATIENT_CLINIC_OR_DEPARTMENT_OTHER): Payer: Self-pay

## 2016-05-13 ENCOUNTER — Emergency Department (HOSPITAL_BASED_OUTPATIENT_CLINIC_OR_DEPARTMENT_OTHER)
Admission: EM | Admit: 2016-05-13 | Discharge: 2016-05-13 | Disposition: A | Discharge status: Home / Self Care | Payer: Self-pay | Attending: Emergency Medicine | Admitting: Emergency Medicine

## 2016-05-13 DIAGNOSIS — E119 Type 2 diabetes mellitus without complications: Secondary | ICD-10-CM | POA: Insufficient documentation

## 2016-05-13 DIAGNOSIS — R112 Nausea with vomiting, unspecified: Secondary | ICD-10-CM | POA: Insufficient documentation

## 2016-05-13 DIAGNOSIS — Z794 Long term (current) use of insulin: Secondary | ICD-10-CM | POA: Insufficient documentation

## 2016-05-13 LAB — COMPREHENSIVE METABOLIC PANEL
ALK PHOS: 79 U/L (ref 38–126)
ALT: 53 U/L (ref 14–54)
AST: 40 U/L (ref 15–41)
Albumin: 4.3 g/dL (ref 3.5–5.0)
Anion gap: 12 (ref 5–15)
BILIRUBIN TOTAL: 0.8 mg/dL (ref 0.3–1.2)
BUN: 19 mg/dL (ref 6–20)
CALCIUM: 9.6 mg/dL (ref 8.9–10.3)
CO2: 22 mmol/L (ref 22–32)
CREATININE: 0.81 mg/dL (ref 0.44–1.00)
Chloride: 105 mmol/L (ref 101–111)
GFR calc Af Amer: >60 mL/min (ref >60)
Glucose, Bld: 70 mg/dL (ref 65–99)
Potassium: 3.8 mmol/L (ref 3.5–5.1)
Sodium: 139 mmol/L (ref 135–145)
TOTAL PROTEIN: 8.5 g/dL — AB (ref 6.5–8.1)

## 2016-05-13 LAB — LIPASE, BLOOD: Lipase: 26 U/L (ref 11–51)

## 2016-05-13 LAB — CBC WITH DIFFERENTIAL/PLATELET
Basophils Absolute: 0 10*3/uL (ref 0.0–0.1)
Basophils Relative: 0 %
EOS PCT: 3 %
Eosinophils Absolute: 0.4 10*3/uL (ref 0.0–0.7)
HEMATOCRIT: 35 % — AB (ref 36.0–46.0)
HEMOGLOBIN: 12.1 g/dL (ref 12.0–15.0)
LYMPHS ABS: 2.8 10*3/uL (ref 0.7–4.0)
LYMPHS PCT: 24 %
MCH: 25.9 pg — AB (ref 26.0–34.0)
MCHC: 34.6 g/dL (ref 30.0–36.0)
MCV: 74.9 fL — AB (ref 78.0–100.0)
Monocytes Absolute: 0.8 10*3/uL (ref 0.1–1.0)
Monocytes Relative: 7 %
Neutro Abs: 7.6 10*3/uL (ref 1.7–7.7)
Neutrophils Relative %: 66 %
PLATELETS: 384 10*3/uL (ref 150–400)
RBC: 4.67 MIL/uL (ref 3.87–5.11)
RDW: 15.5 % (ref 11.5–15.5)
WBC: 11.6 10*3/uL — AB (ref 4.0–10.5)

## 2016-05-13 LAB — CBG MONITORING, ED: Glucose-Capillary: 62 mg/dL — ABNORMAL LOW (ref 65–99)

## 2016-05-13 MED ORDER — DEXTROSE-NACL 5-0.9 % IV SOLN
Freq: Once | INTRAVENOUS | Status: AC
  Administered 2016-05-13: 04:00:00 via INTRAVENOUS

## 2016-05-13 MED ORDER — ONDANSETRON 8 MG PO TBDP: 8.0000 mg | ORAL_TABLET | Freq: Three times a day (TID) | ORAL | 0 refills | Status: DC | PRN

## 2016-05-13 MED ORDER — ONDANSETRON HCL 4 MG/2ML IJ SOLN
4.0000 mg | Freq: Once | INTRAMUSCULAR | Status: AC
  Administered 2016-05-13: 4 mg via INTRAVENOUS
  Filled 2016-05-13: qty 2

## 2016-05-13 NOTE — ED Notes (Signed)
Attempted IV in left AC unsuccessful  

## 2016-05-13 NOTE — ED Triage Notes (Signed)
Pt states she thinks she has food poison, states vomit x4 in 2hrs, states the only thing that came up was her sub way sandwich. Pt states was able to keep power aide down.

## 2016-05-13 NOTE — ED Provider Notes (Signed)
[facility] Provider Note: Lowella DellJ. Lane Lynnae Ludemann, MD, FACEP  CSN: [id number] MRN: [id number] ARRIVAL: 05/13/16 at 0335 ROOM: MH10/MH10   CHIEF COMPLAINT  Vomiting   HISTORY OF PRESENT ILLNESS  Tricia Boone is a 25 y.o. female with a history of insulin-dependent diabetes. She is here with nausea and vomiting that began about 2 hours ago. The onset was sudden. She has vomited about 4 times. There is associated cramping abdominal pain primarily in the epigastrium. She denies diarrhea or fever. There are no specific mitigating or exacerbating factors.   Past Medical History:  Diagnosis Date  . Diabetes mellitus without complication (HCC)   . High cholesterol     Past Surgical History:  Procedure Laterality Date  . INCISION AND DRAINAGE BREAST ABSCESS      No family history on file.  Social History  Substance Use Topics  . Smoking status: Never Smoker  . Smokeless tobacco: Never Used  . Alcohol use No    Prior to Admission medications   Medication Sig Start Date End Date Taking? Authorizing Provider  acetaminophen (TYLENOL) 325 MG tablet Take 650 mg by mouth every 6 (six) hours as needed.    Historical Provider, MD  insulin aspart (NOVOLOG) 100 UNIT/ML injection Inject into the skin 3 (three) times daily before meals.    Historical Provider, MD  insulin detemir (LEVEMIR) 100 UNIT/ML injection Inject into the skin at bedtime.    Historical Provider, MD  lisinopril (PRINIVIL,ZESTRIL) 5 MG tablet Take 5 mg by mouth daily.    Historical Provider, MD  PRAVASTATIN SODIUM PO Take by mouth.    Historical Provider, MD  sulfamethoxazole-trimethoprim (BACTRIM DS,SEPTRA DS) 800-160 MG tablet Take 1 tablet by mouth 2 (two) times daily. 04/30/16   Mercedes Camprubi-Soms, PA-C    Allergies Patient has no known allergies.   REVIEW OF SYSTEMS  Negative except as noted here or in the History of Present Illness.   PHYSICAL EXAMINATION  Initial Vital Signs Blood pressure  (!) 153/102, pulse 108, temperature 98.5 F (36.9 C), temperature source Oral, resp. rate 20, height 5' 2.5" (1.588 m), weight 135 lb (61.2 kg), last menstrual period 04/08/2016, SpO2 100 %.  Examination General: Well-developed, well-nourished female in no acute distress; appearance consistent with age of record HENT: normocephalic; atraumatic Eyes: pupils equal, round and reactive to light; extraocular muscles intact Neck: supple Heart: regular rate and rhythm; tachycardia Lungs: clear to auscultation bilaterally Abdomen: soft; nondistended; epigastric tenderness; no masses or hepatosplenomegaly; bowel sounds present Extremities: No deformity; full range of motion; pulses normal Neurologic: Awake, alert; motor function intact in all extremities and symmetric; no facial droop Skin: Warm and dry Psychiatric: Flat affect   RESULTS  Summary of this visit's results, reviewed by myself:   EKG Interpretation  Date/Time:    Ventricular Rate:    PR Interval:    QRS Duration:   QT Interval:    QTC Calculation:   R Axis:     Text Interpretation:        Laboratory Studies: Results for orders placed or performed during the hospital encounter of 05/13/16 (from the past 24 hour(s))  CBG monitoring, ED     Status: Abnormal   Collection Time: 05/13/16  3:44 AM  Result Value Ref Range   Glucose-Capillary 62 (L) 65 - 99 mg/dL  CBC with Differential/Platelet     Status: Abnormal   Collection Time: 05/13/16  3:50 AM  Result Value Ref Range   WBC 11.6 (H) 4.0 - 10.5  K/uL   RBC 4.67 3.87 - 5.11 MIL/uL   Hemoglobin 12.1 12.0 - 15.0 g/dL   HCT 16.135.0 (L) 09.636.0 - 04.546.0 %   MCV 74.9 (L) 78.0 - 100.0 fL   MCH 25.9 (L) 26.0 - 34.0 pg   MCHC 34.6 30.0 - 36.0 g/dL   RDW 40.915.5 81.111.5 - 91.415.5 %   Platelets 384 150 - 400 K/uL   Neutrophils Relative % 66 %   Neutro Abs 7.6 1.7 - 7.7 K/uL   Lymphocytes Relative 24 %   Lymphs Abs 2.8 0.7 - 4.0 K/uL   Monocytes Relative 7 %   Monocytes Absolute 0.8 0.1 -  1.0 K/uL   Eosinophils Relative 3 %   Eosinophils Absolute 0.4 0.0 - 0.7 K/uL   Basophils Relative 0 %   Basophils Absolute 0.0 0.0 - 0.1 K/uL  Lipase, blood     Status: None   Collection Time: 05/13/16  3:50 AM  Result Value Ref Range   Lipase 26 11 - 51 U/L  Comprehensive metabolic panel     Status: Abnormal   Collection Time: 05/13/16  3:50 AM  Result Value Ref Range   Sodium 139 135 - 145 mmol/L   Potassium 3.8 3.5 - 5.1 mmol/L   Chloride 105 101 - 111 mmol/L   CO2 22 22 - 32 mmol/L   Glucose, Bld 70 65 - 99 mg/dL   BUN 19 6 - 20 mg/dL   Creatinine, Ser 7.820.81 0.44 - 1.00 mg/dL   Calcium 9.6 8.9 - 95.610.3 mg/dL   Total Protein 8.5 (H) 6.5 - 8.1 g/dL   Albumin 4.3 3.5 - 5.0 g/dL   AST 40 15 - 41 U/L   ALT 53 14 - 54 U/L   Alkaline Phosphatase 79 38 - 126 U/L   Total Bilirubin 0.8 0.3 - 1.2 mg/dL   GFR calc non Af Amer >60 >60 mL/min   GFR calc Af Amer >60 >60 mL/min   Anion gap 12 5 - 15   Imaging Studies: No results found.  ED COURSE  Nursing notes and initial vitals signs, including pulse oximetry, reviewed.  Vitals:   05/13/16 0341  BP: (!) 153/102  Pulse: 108  Resp: 20  Temp: 98.5 F (36.9 C)  TempSrc: Oral  SpO2: 100%  Weight: 135 lb (61.2 kg)  Height: 5' 2.5" (1.588 m)   6:42 AM Patient drinking fluids without emesis. She is requesting discharge at this time.  PROCEDURES    ED DIAGNOSES     ICD-9-CM ICD-10-CM   1. Nausea and vomiting in adult 787.01 R11.2        Paula LibraJohn Dasie Chancellor, MD 05/13/16 [phone]

## 2016-07-07 ENCOUNTER — Encounter (HOSPITAL_BASED_OUTPATIENT_CLINIC_OR_DEPARTMENT_OTHER): Payer: Self-pay | Admitting: Emergency Medicine

## 2016-07-07 ENCOUNTER — Emergency Department (HOSPITAL_BASED_OUTPATIENT_CLINIC_OR_DEPARTMENT_OTHER): Payer: Self-pay

## 2016-07-07 ENCOUNTER — Emergency Department (HOSPITAL_BASED_OUTPATIENT_CLINIC_OR_DEPARTMENT_OTHER)
Admission: EM | Admit: 2016-07-07 | Discharge: 2016-07-07 | Disposition: A | Discharge status: Other Acute Inpatient Hospital | Payer: Self-pay | Attending: Emergency Medicine | Admitting: Emergency Medicine

## 2016-07-07 DIAGNOSIS — E860 Dehydration: Secondary | ICD-10-CM | POA: Insufficient documentation

## 2016-07-07 DIAGNOSIS — Z79899 Other long term (current) drug therapy: Secondary | ICD-10-CM | POA: Insufficient documentation

## 2016-07-07 DIAGNOSIS — R112 Nausea with vomiting, unspecified: Secondary | ICD-10-CM

## 2016-07-07 DIAGNOSIS — E119 Type 2 diabetes mellitus without complications: Secondary | ICD-10-CM | POA: Insufficient documentation

## 2016-07-07 DIAGNOSIS — R079 Chest pain, unspecified: Secondary | ICD-10-CM | POA: Insufficient documentation

## 2016-07-07 DIAGNOSIS — Z794 Long term (current) use of insulin: Secondary | ICD-10-CM | POA: Insufficient documentation

## 2016-07-07 LAB — COMPREHENSIVE METABOLIC PANEL
ALBUMIN: 3.8 g/dL (ref 3.5–5.0)
ALT: 57 U/L — ABNORMAL HIGH (ref 14–54)
AST: 47 U/L — AB (ref 15–41)
Alkaline Phosphatase: 110 U/L (ref 38–126)
Anion gap: 19 — ABNORMAL HIGH (ref 5–15)
BILIRUBIN TOTAL: 0.9 mg/dL (ref 0.3–1.2)
BUN: 24 mg/dL — AB (ref 6–20)
CHLORIDE: 110 mmol/L (ref 101–111)
CO2: 11 mmol/L — AB (ref 22–32)
Calcium: 9.8 mg/dL (ref 8.9–10.3)
Creatinine, Ser: 1.21 mg/dL — ABNORMAL HIGH (ref 0.44–1.00)
GFR calc Af Amer: >60 mL/min (ref >60)
GFR calc non Af Amer: >60 mL/min (ref >60)
GLUCOSE: 92 mg/dL (ref 65–99)
POTASSIUM: 3.9 mmol/L (ref 3.5–5.1)
SODIUM: 140 mmol/L (ref 135–145)
TOTAL PROTEIN: 8.3 g/dL — AB (ref 6.5–8.1)

## 2016-07-07 LAB — URINALYSIS, MICROSCOPIC (REFLEX): RBC / HPF: NONE SEEN RBC/hpf (ref 0–5)

## 2016-07-07 LAB — CBC WITH DIFFERENTIAL/PLATELET
BASOS PCT: 0 %
Basophils Absolute: 0 10*3/uL (ref 0.0–0.1)
EOS PCT: 0 %
Eosinophils Absolute: 0 10*3/uL (ref 0.0–0.7)
HEMATOCRIT: 36.5 % (ref 36.0–46.0)
HEMOGLOBIN: 11.9 g/dL — AB (ref 12.0–15.0)
Lymphocytes Relative: 20 %
Lymphs Abs: 3.3 10*3/uL (ref 0.7–4.0)
MCH: 26.4 pg (ref 26.0–34.0)
MCHC: 32.6 g/dL (ref 30.0–36.0)
MCV: 80.9 fL (ref 78.0–100.0)
MONOS PCT: 3 %
MYELOCYTES: 3 %
Monocytes Absolute: 0.5 10*3/uL (ref 0.1–1.0)
NEUTROS ABS: 12.7 10*3/uL — AB (ref 1.7–7.7)
Neutrophils Relative %: 74 %
Platelets: 480 10*3/uL — ABNORMAL HIGH (ref 150–400)
RBC: 4.51 MIL/uL (ref 3.87–5.11)
RDW: 16.6 % — ABNORMAL HIGH (ref 11.5–15.5)
WBC: 16.5 10*3/uL — ABNORMAL HIGH (ref 4.0–10.5)

## 2016-07-07 LAB — LIPASE, BLOOD: Lipase: 18 U/L (ref 11–51)

## 2016-07-07 LAB — URINALYSIS, ROUTINE W REFLEX MICROSCOPIC
Bilirubin Urine: NEGATIVE
Glucose, UA: >=500 mg/dL — AB
LEUKOCYTES UA: NEGATIVE
NITRITE: NEGATIVE
PH: 5 (ref 5.0–8.0)
Protein, ur: 30 mg/dL — AB
SPECIFIC GRAVITY, URINE: 1.027 (ref 1.005–1.030)

## 2016-07-07 LAB — RAPID URINE DRUG SCREEN, HOSP PERFORMED
AMPHETAMINES: NOT DETECTED
Barbiturates: NOT DETECTED
Benzodiazepines: NOT DETECTED
Cocaine: NOT DETECTED
Opiates: NOT DETECTED
TETRAHYDROCANNABINOL: NOT DETECTED

## 2016-07-07 LAB — CBG MONITORING, ED
GLUCOSE-CAPILLARY: 94 mg/dL (ref 65–99)
Glucose-Capillary: 296 mg/dL — ABNORMAL HIGH (ref 65–99)

## 2016-07-07 LAB — PREGNANCY, URINE: PREG TEST UR: NEGATIVE

## 2016-07-07 LAB — TROPONIN I: Troponin I: <0.03 ng/mL (ref <0.03)

## 2016-07-07 MED ORDER — SODIUM CHLORIDE 0.9 % IV SOLN: INTRAVENOUS | Status: DC

## 2016-07-07 MED ORDER — SODIUM CHLORIDE 0.9 % IV BOLUS (SEPSIS)
1000.0000 mL | Freq: Once | INTRAVENOUS | Status: AC
  Administered 2016-07-07: 1000 mL via INTRAVENOUS

## 2016-07-07 MED ORDER — PROMETHAZINE HCL 25 MG/ML IJ SOLN
12.5000 mg | Freq: Once | INTRAMUSCULAR | Status: AC
  Administered 2016-07-07: 12.5 mg via INTRAVENOUS
  Filled 2016-07-07: qty 1

## 2016-07-07 MED ORDER — ONDANSETRON 8 MG PO TBDP: 8.0000 mg | ORAL_TABLET | Freq: Once | ORAL | Status: DC

## 2016-07-07 MED ORDER — ONDANSETRON HCL 4 MG/2ML IJ SOLN
4.0000 mg | Freq: Once | INTRAMUSCULAR | Status: AC
  Administered 2016-07-07: 4 mg via INTRAVENOUS
  Filled 2016-07-07: qty 2

## 2016-07-07 NOTE — ED Notes (Signed)
[facility] did not call back. Call made to [facility] for admission.

## 2016-07-07 NOTE — ED Notes (Signed)
Right radial artery x 1 attempt for lab draws per MD order. No complications noted.

## 2016-07-07 NOTE — ED Provider Notes (Signed)
[facility]Y [facility]PT [facility] Provider Note   CSN: [id number] Arrival date & time: 07/07/16  1619     History   Chief Complaint Chief Complaint  Patient presents with  . Chest Pain  . Shortness of Breath    HPI Tricia Boone is a 26 y.o. female.  Patient is a 26 year old female with past medical history of IDDM. She presents today for evaluation of decreased appetite and chest and abdominal discomfort for the past 2 days. She denies any nausea, vomiting, or diarrhea at home, however has vomited here. She denies any fevers or chills. She denies any ill contacts. She states her sugar was approximately 400 prior to coming here.   The history is provided by the patient and a friend.  Chest Pain   This is a new problem. The current episode started 2 days ago. The problem occurs constantly. The problem has been gradually worsening. The pain is moderate. The pain does not radiate. Associated symptoms include shortness of breath.  Shortness of Breath  Associated symptoms include chest pain.    Past Medical History:  Diagnosis Date  . Diabetes mellitus without complication (HCC)   . High cholesterol     There are no active problems to display for this patient.   Past Surgical History:  Procedure Laterality Date  . INCISION AND DRAINAGE BREAST ABSCESS      OB History    No data available       Home Medications    Prior to Admission medications   Medication Sig Start Date End Date Taking? Authorizing Provider  insulin aspart (NOVOLOG) 100 UNIT/ML injection Inject into the skin 3 (three) times daily before meals.   Yes Historical Provider, MD  insulin detemir (LEVEMIR) 100 UNIT/ML injection Inject into the skin at bedtime.   Yes Historical Provider, MD  PRAVASTATIN SODIUM PO Take by mouth.   Yes Historical Provider, MD  acetaminophen (TYLENOL) 325 MG tablet Take 650 mg by mouth every 6 (six) hours as needed.    Historical Provider, MD  lisinopril (PRINIVIL,ZESTRIL) 5 MG  tablet Take 5 mg by mouth daily.    Historical Provider, MD  ondansetron (ZOFRAN ODT) 8 MG disintegrating tablet Take 1 tablet (8 mg total) by mouth every 8 (eight) hours as needed for nausea or vomiting. 05/13/16   John Molpus, MD  sulfamethoxazole-trimethoprim (BACTRIM DS,SEPTRA DS) 800-160 MG tablet Take 1 tablet by mouth 2 (two) times daily. 04/30/16   Mercedes Camprubi-Soms, PA-C    Family History No family history on file.  Social History Social History  Substance Use Topics  . Smoking status: Never Smoker  . Smokeless tobacco: Never Used  . Alcohol use No     Allergies   Patient has no known allergies.   Review of Systems Review of Systems  Respiratory: Positive for shortness of breath.   Cardiovascular: Positive for chest pain.  All other systems reviewed and are negative.    Physical Exam Updated Vital Signs BP 116/87   Pulse (!) 133   Temp 97.7 F (36.5 C) (Oral)   Resp 22   Ht 5' 2.5" (1.588 m)   Wt 125 lb (56.7 kg)   LMP 06/12/2016   SpO2 100%   BMI 22.50 kg/m   Physical Exam  Constitutional: She is oriented to person, place, and time. She appears well-developed and well-nourished. No distress.  HENT:  Head: Normocephalic and atraumatic.  Mouth/Throat: Oropharynx is clear and moist.  Neck: Normal range of motion. Neck supple.  Cardiovascular:  Normal rate and regular rhythm.  Exam reveals no gallop and no friction rub.   No murmur heard. Pulmonary/Chest: Effort normal and breath sounds normal. No respiratory distress. She has no wheezes.  Abdominal: Soft. Bowel sounds are normal. She exhibits no distension. There is no tenderness.  Musculoskeletal: Normal range of motion.  Neurological: She is alert and oriented to person, place, and time.  Skin: Skin is warm and dry. She is not diaphoretic.  Nursing note and vitals reviewed.    ED Treatments / Results  Labs (all labs ordered are listed, but only abnormal results are displayed) Labs Reviewed    CBG MONITORING, ED - Abnormal; Notable for the following:       Result Value   Glucose-Capillary 296 (*)    All other components within normal limits  COMPREHENSIVE METABOLIC PANEL  CBC WITH DIFFERENTIAL/PLATELET  TROPONIN I    EKG  EKG Interpretation  Date/Time:  Wednesday July 07 2016 16:31:39 EST Ventricular Rate:  126 PR Interval:  116 QRS Duration: 72 QT Interval:  330 QTC Calculation: 477 R Axis:   80 Text Interpretation:  Sinus tachycardia Right atrial enlargement Nonspecific ST and T wave abnormality Abnormal ECG Confirmed by Mintie Witherington  MD, Kellar Westberg ([id number]) on 07/07/2016 5:22:15 PM       Radiology No results found.  Procedures Procedures (including critical care time)  Medications Ordered in ED Medications  sodium chloride 0.9 % bolus 1,000 mL (not administered)  ondansetron (ZOFRAN) injection 4 mg (not administered)     Initial Impression / Assessment and Plan / ED Course  I have reviewed the triage vital signs and the nursing notes.  Pertinent labs & imaging results that were available during my care of the patient were reviewed by me and considered in my medical decision making (see chart for details).  Clinical Course     Patient presents with complaints of not feeling well, chest discomfort, abdominal discomfort, nausea, vomiting and she yesterday. Her blood sugars have been running approximately 400.  Her blood sugar on her BMP is 92, however she has an anion gap. This could be related to dehydration. She was given several liters of normal saline and will be [facility] to [facility]. Dr. Zebedee IbaKondreddy agrees to admit.  Final Clinical Impressions(s) / ED Diagnoses   Final diagnoses:  None    New Prescriptions New Prescriptions   No medications on file     Geoffery Lyonsouglas Braya Habermehl, MD 07/07/16 2302

## 2016-07-07 NOTE — ED Notes (Signed)
Went to get pt from ED waiting, pt in clothes, brought pt back to imaging dressing room, asked pt to change into gown, pt stayed in wheelchair, uncooperative, would not answer questions, took pt back to ED waiting, Brandi aware

## 2016-07-07 NOTE — ED Notes (Signed)
Unsuccessful iv attempt in right ac x 1.

## 2016-07-07 NOTE — ED Notes (Signed)
[facility]

## 2016-07-07 NOTE — ED Notes (Signed)
Pt returned from xray

## 2016-07-07 NOTE — ED Triage Notes (Signed)
Chest pain x2 days with decreased appetite. Hx DM last CBG 401.

## 2016-09-18 ENCOUNTER — Inpatient Hospital Stay (HOSPITAL_COMMUNITY): Payer: Self-pay

## 2016-09-18 ENCOUNTER — Telehealth (HOSPITAL_BASED_OUTPATIENT_CLINIC_OR_DEPARTMENT_OTHER): Payer: Self-pay | Admitting: *Deleted

## 2016-09-18 ENCOUNTER — Inpatient Hospital Stay (HOSPITAL_BASED_OUTPATIENT_CLINIC_OR_DEPARTMENT_OTHER)
Admission: EM | Admit: 2016-09-18 | Discharge: 2016-09-24 | DRG: 871 | Disposition: A | Discharge status: Home / Self Care | Payer: Self-pay | Attending: Internal Medicine | Admitting: Internal Medicine

## 2016-09-18 ENCOUNTER — Emergency Department (HOSPITAL_BASED_OUTPATIENT_CLINIC_OR_DEPARTMENT_OTHER): Payer: Self-pay

## 2016-09-18 ENCOUNTER — Encounter (HOSPITAL_BASED_OUTPATIENT_CLINIC_OR_DEPARTMENT_OTHER): Payer: Self-pay | Admitting: *Deleted

## 2016-09-18 DIAGNOSIS — R6510 Systemic inflammatory response syndrome (SIRS) of non-infectious origin without acute organ dysfunction: Secondary | ICD-10-CM | POA: Diagnosis present

## 2016-09-18 DIAGNOSIS — I10 Essential (primary) hypertension: Secondary | ICD-10-CM | POA: Diagnosis present

## 2016-09-18 DIAGNOSIS — A419 Sepsis, unspecified organism: Principal | ICD-10-CM | POA: Diagnosis present

## 2016-09-18 DIAGNOSIS — E109 Type 1 diabetes mellitus without complications: Secondary | ICD-10-CM | POA: Diagnosis present

## 2016-09-18 DIAGNOSIS — K3184 Gastroparesis: Secondary | ICD-10-CM | POA: Diagnosis present

## 2016-09-18 DIAGNOSIS — E785 Hyperlipidemia, unspecified: Secondary | ICD-10-CM | POA: Diagnosis present

## 2016-09-18 DIAGNOSIS — K760 Fatty (change of) liver, not elsewhere classified: Secondary | ICD-10-CM | POA: Diagnosis present

## 2016-09-18 DIAGNOSIS — E7800 Pure hypercholesterolemia, unspecified: Secondary | ICD-10-CM | POA: Diagnosis present

## 2016-09-18 DIAGNOSIS — E872 Acidosis, unspecified: Secondary | ICD-10-CM | POA: Diagnosis present

## 2016-09-18 DIAGNOSIS — R109 Unspecified abdominal pain: Secondary | ICD-10-CM

## 2016-09-18 DIAGNOSIS — E860 Dehydration: Secondary | ICD-10-CM | POA: Diagnosis present

## 2016-09-18 DIAGNOSIS — N179 Acute kidney failure, unspecified: Secondary | ICD-10-CM | POA: Diagnosis present

## 2016-09-18 DIAGNOSIS — E876 Hypokalemia: Secondary | ICD-10-CM | POA: Diagnosis present

## 2016-09-18 DIAGNOSIS — E1010 Type 1 diabetes mellitus with ketoacidosis without coma: Secondary | ICD-10-CM | POA: Diagnosis present

## 2016-09-18 DIAGNOSIS — E1043 Type 1 diabetes mellitus with diabetic autonomic (poly)neuropathy: Secondary | ICD-10-CM | POA: Diagnosis present

## 2016-09-18 DIAGNOSIS — R1110 Vomiting, unspecified: Secondary | ICD-10-CM

## 2016-09-18 DIAGNOSIS — R7989 Other specified abnormal findings of blood chemistry: Secondary | ICD-10-CM

## 2016-09-18 DIAGNOSIS — E1110 Type 2 diabetes mellitus with ketoacidosis without coma: Secondary | ICD-10-CM | POA: Diagnosis present

## 2016-09-18 DIAGNOSIS — K529 Noninfective gastroenteritis and colitis, unspecified: Secondary | ICD-10-CM | POA: Diagnosis present

## 2016-09-18 DIAGNOSIS — N289 Disorder of kidney and ureter, unspecified: Secondary | ICD-10-CM

## 2016-09-18 DIAGNOSIS — G9341 Metabolic encephalopathy: Secondary | ICD-10-CM | POA: Diagnosis present

## 2016-09-18 DIAGNOSIS — E108 Type 1 diabetes mellitus with unspecified complications: Secondary | ICD-10-CM

## 2016-09-18 DIAGNOSIS — G9340 Encephalopathy, unspecified: Secondary | ICD-10-CM | POA: Diagnosis present

## 2016-09-18 DIAGNOSIS — R945 Abnormal results of liver function studies: Secondary | ICD-10-CM

## 2016-09-18 DIAGNOSIS — R112 Nausea with vomiting, unspecified: Secondary | ICD-10-CM

## 2016-09-18 DIAGNOSIS — B370 Candidal stomatitis: Secondary | ICD-10-CM | POA: Diagnosis not present

## 2016-09-18 LAB — BASIC METABOLIC PANEL
BUN: 29 mg/dL — ABNORMAL HIGH (ref 6–20)
CALCIUM: 9.9 mg/dL (ref 8.9–10.3)
CO2: 12 mmol/L — ABNORMAL LOW (ref 22–32)
CREATININE: 1.44 mg/dL — AB (ref 0.44–1.00)
Chloride: 106 mmol/L (ref 101–111)
GFR calc Af Amer: 57 mL/min — ABNORMAL LOW (ref >60)
GFR, EST NON AFRICAN AMERICAN: 49 mL/min — AB (ref >60)
Glucose, Bld: 258 mg/dL — ABNORMAL HIGH (ref 65–99)
Potassium: 5.3 mmol/L — ABNORMAL HIGH (ref 3.5–5.1)
SODIUM: 142 mmol/L (ref 135–145)

## 2016-09-18 LAB — I-STAT VENOUS BLOOD GAS, ED
Acid-base deficit: 14 mmol/L — ABNORMAL HIGH (ref 0.0–2.0)
BICARBONATE: 10.8 mmol/L — AB (ref 20.0–28.0)
O2 Saturation: 70 %
PH VEN: 7.275 (ref 7.250–7.430)
PO2 VEN: 39 mmHg (ref 32.0–45.0)
Patient temperature: 97.8
TCO2: 11 mmol/L (ref 0–100)
pCO2, Ven: 23.1 mmHg — ABNORMAL LOW (ref 44.0–60.0)

## 2016-09-18 LAB — CBG MONITORING, ED
GLUCOSE-CAPILLARY: 289 mg/dL — AB (ref 65–99)
GLUCOSE-CAPILLARY: 108 mg/dL — AB (ref 65–99)
GLUCOSE-CAPILLARY: 95 mg/dL (ref 65–99)
Glucose-Capillary: 170 mg/dL — ABNORMAL HIGH (ref 65–99)

## 2016-09-18 LAB — CBC
HCT: 40.6 % (ref 36.0–46.0)
HEMOGLOBIN: 13.5 g/dL (ref 12.0–15.0)
MCH: 26.2 pg (ref 26.0–34.0)
MCHC: 33.3 g/dL (ref 30.0–36.0)
MCV: 78.8 fL (ref 78.0–100.0)
PLATELETS: 462 10*3/uL — AB (ref 150–400)
RBC: 5.15 MIL/uL — AB (ref 3.87–5.11)
RDW: 14.7 % (ref 11.5–15.5)
WBC: 21.5 10*3/uL — ABNORMAL HIGH (ref 4.0–10.5)

## 2016-09-18 LAB — GLUCOSE, CAPILLARY: Glucose-Capillary: 162 mg/dL — ABNORMAL HIGH (ref 65–99)

## 2016-09-18 LAB — LACTIC ACID, PLASMA: Lactic Acid, Venous: 1.6 mmol/L (ref 0.5–1.9)

## 2016-09-18 MED ORDER — DEXTROSE-NACL 5-0.9 % IV SOLN
INTRAVENOUS | Status: DC
  Administered 2016-09-18: 16:00:00 via INTRAVENOUS

## 2016-09-18 MED ORDER — INSULIN REGULAR HUMAN 100 UNIT/ML IJ SOLN
5.0000 [IU] | Freq: Once | INTRAMUSCULAR | Status: AC
  Administered 2016-09-18: 5 [IU] via INTRAVENOUS
  Filled 2016-09-18: qty 1

## 2016-09-18 MED ORDER — ENOXAPARIN SODIUM 40 MG/0.4ML ~~LOC~~ SOLN
40.0000 mg | SUBCUTANEOUS | Status: DC
  Filled 2016-09-18 (×4): qty 0.4

## 2016-09-18 MED ORDER — SODIUM CHLORIDE 0.9 % IV SOLN
INTRAVENOUS | Status: DC
  Filled 2016-09-18: qty 2.5

## 2016-09-18 MED ORDER — SODIUM CHLORIDE 0.9 % IV BOLUS (SEPSIS)
1000.0000 mL | Freq: Once | INTRAVENOUS | Status: AC
  Administered 2016-09-18: 1000 mL via INTRAVENOUS

## 2016-09-18 MED ORDER — SODIUM CHLORIDE 0.9 % IV SOLN: INTRAVENOUS | Status: DC

## 2016-09-18 MED ORDER — SODIUM CHLORIDE 0.9 % IV BOLUS (SEPSIS)
2000.0000 mL | Freq: Once | INTRAVENOUS | Status: AC
  Administered 2016-09-18: 2000 mL via INTRAVENOUS

## 2016-09-18 MED ORDER — ONDANSETRON HCL 4 MG/2ML IJ SOLN
4.0000 mg | Freq: Once | INTRAMUSCULAR | Status: AC
Start: 2016-09-18 — End: 2016-09-18
  Administered 2016-09-18: 4 mg via INTRAVENOUS
  Filled 2016-09-18: qty 2

## 2016-09-18 MED ORDER — LIDOCAINE VISCOUS 2 % MT SOLN
15.0000 mL | Freq: Once | OROMUCOSAL | Status: AC
Start: 2016-09-18 — End: 2016-09-18
  Administered 2016-09-18: 15 mL via OROMUCOSAL
  Filled 2016-09-18: qty 15

## 2016-09-18 MED ORDER — ONDANSETRON HCL 4 MG/2ML IJ SOLN
4.0000 mg | Freq: Once | INTRAMUSCULAR | Status: DC
Start: 2016-09-18 — End: 2016-09-18

## 2016-09-18 MED ORDER — ONDANSETRON HCL 4 MG PO TABS: 4.0000 mg | ORAL_TABLET | Freq: Four times a day (QID) | ORAL | Status: DC | PRN

## 2016-09-18 MED ORDER — HYDROCODONE-ACETAMINOPHEN 5-325 MG PO TABS
1.0000 | ORAL_TABLET | ORAL | Status: DC | PRN
  Administered 2016-09-22 – 2016-09-24 (×4): 2 via ORAL
  Filled 2016-09-18 (×4): qty 2

## 2016-09-18 MED ORDER — INSULIN ASPART 100 UNIT/ML ~~LOC~~ SOLN
0.0000 [IU] | SUBCUTANEOUS | Status: DC
  Administered 2016-09-18: 3 [IU] via SUBCUTANEOUS
  Administered 2016-09-19: 2 [IU] via SUBCUTANEOUS
  Administered 2016-09-19: 1 [IU] via SUBCUTANEOUS
  Administered 2016-09-19: 5 [IU] via SUBCUTANEOUS
  Administered 2016-09-19: 3 [IU] via SUBCUTANEOUS
  Administered 2016-09-20 (×2): 2 [IU] via SUBCUTANEOUS
  Administered 2016-09-20: 1 [IU] via SUBCUTANEOUS
  Administered 2016-09-20 – 2016-09-21 (×3): 2 [IU] via SUBCUTANEOUS
  Administered 2016-09-21: 1 [IU] via SUBCUTANEOUS
  Administered 2016-09-21 – 2016-09-22 (×2): 3 [IU] via SUBCUTANEOUS
  Administered 2016-09-22: 1 [IU] via SUBCUTANEOUS
  Administered 2016-09-22 (×2): 2 [IU] via SUBCUTANEOUS
  Administered 2016-09-22: 1 [IU] via SUBCUTANEOUS
  Administered 2016-09-22 – 2016-09-23 (×3): 2 [IU] via SUBCUTANEOUS
  Administered 2016-09-23: 1 [IU] via SUBCUTANEOUS
  Administered 2016-09-23: 3 [IU] via SUBCUTANEOUS
  Administered 2016-09-23 – 2016-09-24 (×2): 1 [IU] via SUBCUTANEOUS
  Administered 2016-09-24: 2 [IU] via SUBCUTANEOUS

## 2016-09-18 MED ORDER — MORPHINE SULFATE (PF) 4 MG/ML IV SOLN
2.0000 mg | INTRAVENOUS | Status: DC | PRN
  Administered 2016-09-19 – 2016-09-24 (×5): 2 mg via INTRAVENOUS
  Filled 2016-09-18 (×5): qty 1

## 2016-09-18 MED ORDER — POLYETHYLENE GLYCOL 3350 17 G PO PACK: 17.0000 g | PACK | Freq: Every day | ORAL | Status: DC | PRN

## 2016-09-18 MED ORDER — LABETALOL HCL 5 MG/ML IV SOLN
10.0000 mg | INTRAVENOUS | Status: DC | PRN
  Administered 2016-09-24: 10 mg via INTRAVENOUS
  Filled 2016-09-18 (×4): qty 4

## 2016-09-18 MED ORDER — VANCOMYCIN HCL IN DEXTROSE 1-5 GM/200ML-% IV SOLN
1000.0000 mg | Freq: Once | INTRAVENOUS | Status: AC
  Administered 2016-09-18: 1000 mg via INTRAVENOUS
  Filled 2016-09-18: qty 200

## 2016-09-18 MED ORDER — INSULIN DETEMIR 100 UNIT/ML ~~LOC~~ SOLN
20.0000 [IU] | Freq: Every day | SUBCUTANEOUS | Status: DC
  Administered 2016-09-18 – 2016-09-23 (×6): 20 [IU] via SUBCUTANEOUS
  Filled 2016-09-18 (×7): qty 0.2

## 2016-09-18 MED ORDER — SODIUM CHLORIDE 0.9 % IV SOLN
500.0000 mg | Freq: Two times a day (BID) | INTRAVENOUS | Status: DC
  Administered 2016-09-19 – 2016-09-21 (×6): 500 mg via INTRAVENOUS
  Filled 2016-09-18 (×6): qty 500

## 2016-09-18 MED ORDER — ONDANSETRON HCL 4 MG/2ML IJ SOLN
4.0000 mg | Freq: Four times a day (QID) | INTRAMUSCULAR | Status: DC | PRN
  Administered 2016-09-18 – 2016-09-23 (×5): 4 mg via INTRAVENOUS
  Filled 2016-09-18 (×5): qty 2

## 2016-09-18 MED ORDER — ACETAMINOPHEN 650 MG RE SUPP: 650.0000 mg | Freq: Four times a day (QID) | RECTAL | Status: DC | PRN

## 2016-09-18 MED ORDER — PIPERACILLIN-TAZOBACTAM 3.375 G IVPB 30 MIN
3.3750 g | Freq: Once | INTRAVENOUS | Status: AC
  Administered 2016-09-18: 3.375 g via INTRAVENOUS
  Filled 2016-09-18: qty 50

## 2016-09-18 MED ORDER — BISACODYL 5 MG PO TBEC: 5.0000 mg | DELAYED_RELEASE_TABLET | Freq: Every day | ORAL | Status: DC | PRN

## 2016-09-18 MED ORDER — SODIUM CHLORIDE 0.9 % IV SOLN
INTRAVENOUS | Status: DC
  Administered 2016-09-18: 21:00:00 via INTRAVENOUS

## 2016-09-18 MED ORDER — PIPERACILLIN-TAZOBACTAM 3.375 G IVPB
3.3750 g | Freq: Three times a day (TID) | INTRAVENOUS | Status: DC
  Administered 2016-09-19 – 2016-09-22 (×10): 3.375 g via INTRAVENOUS
  Filled 2016-09-18 (×11): qty 50

## 2016-09-18 MED ORDER — ONDANSETRON HCL 4 MG/2ML IJ SOLN
4.0000 mg | Freq: Once | INTRAMUSCULAR | Status: AC
  Administered 2016-09-18: 4 mg via INTRAVENOUS
  Filled 2016-09-18: qty 2

## 2016-09-18 MED ORDER — INSULIN REGULAR HUMAN 100 UNIT/ML IJ SOLN
INTRAMUSCULAR | Status: AC
  Filled 2016-09-18: qty 1

## 2016-09-18 MED ORDER — SODIUM CHLORIDE 0.9% FLUSH
3.0000 mL | Freq: Two times a day (BID) | INTRAVENOUS | Status: DC
  Administered 2016-09-19 – 2016-09-23 (×3): 3 mL via INTRAVENOUS

## 2016-09-18 MED ORDER — DEXTROSE-NACL 5-0.45 % IV SOLN
INTRAVENOUS | Status: DC
  Administered 2016-09-18: 20:00:00 via INTRAVENOUS

## 2016-09-18 MED ORDER — DEXTROSE-NACL 5-0.45 % IV SOLN: INTRAVENOUS | Status: DC

## 2016-09-18 MED ORDER — ACETAMINOPHEN 325 MG PO TABS: 650.0000 mg | ORAL_TABLET | Freq: Four times a day (QID) | ORAL | Status: DC | PRN

## 2016-09-18 NOTE — ED Notes (Signed)
Pt reporting sore throat and painful to swallow. EDP notified, see new orders.

## 2016-09-18 NOTE — ED Triage Notes (Signed)
Pt a type 1 diabetic.  States that she has been vomiting since 3am.  25units novolog and 25units levemir-CBG read 'high' on home meter.  Pt lethargic in triage.  Vomiting x 1 noted.  Denies pain.

## 2016-09-18 NOTE — Progress Notes (Signed)
Report received from Madelaine BhatAdam, rn at [facility]. Adam reported that ptient should be enroute within next 20 minutes. VWilliams,rn.

## 2016-09-18 NOTE — ED Notes (Addendum)
[facility] is aware of bed and on the way for transport to room 1433

## 2016-09-18 NOTE — ED Notes (Signed)
Pt on auto VS  

## 2016-09-18 NOTE — Progress Notes (Signed)
26 year old lady with type 1 DM, presents to [facility] with nausea, vomiting and lethargy. She was found to be in DKA with  AG > 20. She was given 5 units of insulin and her  Last 3 CBG's have been less than 200. Her last cbg was 95 and she was n't started on insulin gtt. Request to be admitted for encephalopathy and persistent nausea, vomiting and metabolic acidosis.  [facility]   Kathlen ModyVijaya Antionetta Ator, MD [phone]

## 2016-09-18 NOTE — ED Notes (Signed)
ED Provider at bedside. 

## 2016-09-18 NOTE — H&P (Signed)
History and Physical    Tricia Boone:[id number] DOB: [birth year] DOA: 09/18/2016  PCP: No PCP Per Patient   Patient coming from: Home, by way of [facility]   Chief Complaint: Malaise, vomiting, gen abd pain, uncontrolled glucose  HPI: Tricia Boone is a 26 y.o. female with medical history significant for type 1 diabetes mellitus, presenting in transfer from [facility] where she was seen for malaise, lethargy, generalized abdominal discomfort, and vomiting. Patient reports that she was in her usual state of health until last night when she noted the insidious development of malaise, lethargy, and generalized abdominal discomfort with recurrent nonbloody nonbilious vomiting. Of note, she reports running out of syringes and not using any insulin yesterday for this reason. She denies recent fevers or chills, denies cough or dyspnea, and denies any chest pain or palpitations. Patient also denies any significant use of alcohol or recent use of illicit substances. She denies recent fall or trauma and denies headache, change in vision or hearing, or focal numbness or weakness. She describes her abdominal pain as, moderate in intensity, generalized, with no alleviating or exacerbating factors identified, associated with nausea and vomiting, but no diarrhea. She also denies melena or hematochezia. There's been no recent long distance travel or sick contacts.   [facility] ED Course: Upon arrival to the [facility] ED, patient is found to be afebrile, mildly tachypneic, tachycardic in the 130s, and with stable blood pressure. Chest x-ray is negative for acute cardiopulmonary disease and chemistry panels notable for a potassium of 5.3, bicarbonate of 12, creatinine 1.44, glucose 258, and anion gap ">20." CBC was notable for a leukocytosis to 21,500 and a thrombocytosis 462,000. Patient was given a total of 4 L of normal saline, multiple doses of IV Zofran, and 5 units of IV Novolin.  Glucose came down to the low 100s with insulin and has remained in that range. Insulin infusion was not started and the IV fluids were changed to D5 in normal saline. Patient was noted to be somewhat confused and very lethargic in the emergency department and UDS was ordered and remains pending. Transfer to [facility] was requested for admission in order to further evaluate and manage patient's metabolic acidosis and acute encephalopathy, likely due to DKA in the setting of missed insulin yesterday, but with concern remaining for an alternative etiology such as infection.  Review of Systems:  All other systems reviewed and apart from HPI, are negative.  Past Medical History:  Diagnosis Date  . Diabetes mellitus without complication (HCC)   . High cholesterol     Past Surgical History:  Procedure Laterality Date  . INCISION AND DRAINAGE BREAST ABSCESS       reports that she has never smoked. She has never used smokeless tobacco. She reports that she does not drink alcohol or use drugs.  No Known Allergies  History reviewed. No pertinent family history.   Prior to Admission medications   Medication Sig Start Date End Date Taking? Authorizing Provider  acetaminophen (TYLENOL) 325 MG tablet Take 650 mg by mouth every 6 (six) hours as needed.   Yes Historical Provider, MD  insulin aspart (NOVOLOG) 100 UNIT/ML injection Inject 25 Units into the skin 3 (three) times daily before meals.    Yes Historical Provider, MD  insulin detemir (LEVEMIR) 100 UNIT/ML injection Inject 25 Units into the skin at bedtime.    Yes Historical Provider, MD  ondansetron (ZOFRAN ODT) 8 MG disintegrating tablet Take 1  tablet (8 mg total) by mouth every 8 (eight) hours as needed for nausea or vomiting. Patient not taking: Reported on 09/18/2016 05/13/16   Paula LibraJohn Molpus, MD    Physical Exam: Vitals:   09/18/16 1630 09/18/16 1700 09/18/16 1714 09/18/16 1815  BP: (!) 136/93 (!) 145/95 (!) 150/98 (!) 147/96    Pulse: (!) 126 (!) 124 (!) 129 (!) 117  Resp: (!) 22 13 (!) 24 (!) 21  Temp:    98.6 F (37 C)  TempSrc:    Oral  SpO2: 100% 100% 100% 100%  Weight:    58.5 kg (128 lb 15.5 oz)  Height:    5\' 2"  (1.575 m)      Constitutional: No respiratory distress, lethargic, clutching emesis bag.  Eyes: PERTLA, lids and conjunctivae normal ENMT: Mucous membranes are moist. Posterior pharynx clear of any exudate or lesions.   Neck: normal, supple, no masses, no thyromegaly Respiratory: clear to auscultation bilaterally, no wheezing, no crackles. Tachypnea. No accessory muscle use.  Cardiovascular: Rate ~110 and regular. No significant JVD. No diaphoresis.  Abdomen: No distension, soft, mildly tender throughout, no rebound pain or guarding, no masses palpated. Bowel sounds active.  Musculoskeletal: no clubbing / cyanosis. No joint deformity upper and lower extremities. Normal muscle tone.  Skin: no significant rashes, lesions, ulcers. Warm, dry, well-perfused. Poor turgor.  Neurologic: CN 2-12 grossly intact. Sensation intact, DTR normal. Strength 5/5 in all 4 limbs.  Psychiatric: Lethargy, somnolence, falling asleep during interview. Easily roused and oriented x3.      Labs on Admission: I have personally reviewed following labs and imaging studies  CBC:  Recent Labs Lab 09/18/16 1120  WBC 21.5*  HGB 13.5  HCT 40.6  MCV 78.8  PLT 462*   Basic Metabolic Panel:  Recent Labs Lab 09/18/16 1120  NA 142  K 5.3*  CL 106  CO2 12*  GLUCOSE 258*  BUN 29*  CREATININE 1.44*  CALCIUM 9.9   GFR: Estimated Creatinine Clearance: 46.4 mL/min (A) (by C-G formula based on SCr of 1.44 mg/dL (H)). Liver Function Tests: No results for input(s): AST, ALT, ALKPHOS, BILITOT, PROT, ALBUMIN in the last 168 hours. No results for input(s): LIPASE, AMYLASE in the last 168 hours. No results for input(s): AMMONIA in the last 168 hours. Coagulation Profile: No results for input(s): INR, PROTIME in the  last 168 hours. Cardiac Enzymes: No results for input(s): CKTOTAL, CKMB, CKMBINDEX, TROPONINI in the last 168 hours. BNP (last 3 results) No results for input(s): PROBNP in the last 8760 hours. HbA1C: No results for input(s): HGBA1C in the last 72 hours. CBG:  Recent Labs Lab 09/18/16 1115 09/18/16 1245 09/18/16 1531 09/18/16 1651  GLUCAP 289* 170* 108* 95   Lipid Profile: No results for input(s): CHOL, HDL, LDLCALC, TRIG, CHOLHDL, LDLDIRECT in the last 72 hours. Thyroid Function Tests: No results for input(s): TSH, T4TOTAL, FREET4, T3FREE, THYROIDAB in the last 72 hours. Anemia Panel: No results for input(s): VITAMINB12, FOLATE, FERRITIN, TIBC, IRON, RETICCTPCT in the last 72 hours. Urine analysis:    Component Value Date/Time   COLORURINE YELLOW 07/07/2016 2015   APPEARANCEUR CLOUDY (A) 07/07/2016 2015   LABSPEC 1.027 07/07/2016 2015   PHURINE 5.0 07/07/2016 2015   GLUCOSEU >=500 (A) 07/07/2016 2015   HGBUR TRACE (A) 07/07/2016 2015   BILIRUBINUR NEGATIVE 07/07/2016 2015   KETONESUR >80 (A) 07/07/2016 2015   PROTEINUR 30 (A) 07/07/2016 2015   UROBILINOGEN 0.2 05/15/2015 1000   NITRITE NEGATIVE 07/07/2016 2015   LEUKOCYTESUR  NEGATIVE 07/07/2016 2015   Sepsis Labs: @LABRCNTIP (procalcitonin:4,lacticidven:4) )No results found for this or any previous visit (from the past 240 hour(s)).   Radiological Exams on Admission: Dg Chest 2 View  Result Date: 09/18/2016 CLINICAL DATA:  Vomiting. EXAM: CHEST  2 VIEW COMPARISON:  07/07/2017. FINDINGS: The heart size and mediastinal contours are within normal limits. Both lungs are clear. The visualized skeletal structures are unremarkable. IMPRESSION: Normal examination. Electronically Signed   By: Beckie Salts M.D.   On: 09/18/2016 13:24    EKG: Ordered and pending.   Assessment/Plan  1. DKA, type 1 DM  - Pt presented to [facility] with malaise, lethargy, vomiting after going a day without insulin  - She was found to have metabolic  acidosis with elevated AG, ketones not yet checked  - She was treated with aggressive fluid-resuscitation and 5 units of IV Novolin at [facility] and CBG's came down to 100-range  - Plan to give resume her Levemir now, check CBG q4h and provide a sliding-scale correctional  - Has been 9 hrs since last chem panel, will repeat now - Monitor on telemetry given associated electrolyte derangements, continue IVF, continue antiemetics, and advance diet as tolerated    2. SIRS  - Presents with marked leukocytosis, tachypnea; likely secondary to DKA given her missed insulin yesterday and no fever, but given her critical illness and suprisingly low sugar for DKA, will obtain cultures now, check urine, and cover with empiric abx  - Sxs are GI and systemic, will check KUB and UA; CXR clear, tachypnea likely compensatory for acidosis; no meningismus or wound - If patient's condition improves as expected overnight with IVF and insulin, and cultures remain negative, abx can likely be discontinued    3. Acute encephalopathy  - Was noted to be lethargic and confused at [facility]  - Confusion seems to have cleared by arrival at [facility], but she remains somnolent, possibly from DKA  - UDS is pending, sepsis workup underway  - Likely a metabolic encephalopathy in setting of marked electrolyte derangements   4. Renal insufficiency  - SCr is 1.44 on admission, with most recent priors in 0.8-1.2 range  - She is markedly dehydrated on admission, was given 4 liters NS at the outside hospital  - Continue IVF, avoid nephrotoxins, repeat chem panel     DVT prophylaxis: sq Lovenox  Code Status: Full  Family Communication: Discussed with patient Disposition Plan: Admit to telemetry Consults called: None Admission status: Inpatient   Briscoe Deutscher, MD [facility] Pager [phone]  If 7PM-7AM, please contact night-coverage [email or web address] Password [facility]  09/18/2016, 7:46 PM

## 2016-09-18 NOTE — ED Notes (Signed)
Pt given graham crackers and peanut butter and encouraged to eat some to help maintain BGL

## 2016-09-18 NOTE — ED Provider Notes (Addendum)
[facility] Provider Note   CSN: [id number] Arrival date & time: 09/18/16  1059     History   Chief Complaint Chief Complaint  Patient presents with  . Emesis    HPI Tricia Boone is a 26 y.o. female.  Patient with a history of type 1 diabetes. Is on insulin. Patient didn't take any of her insulin yesterday. Patient followed by [facility]. Patient states she had onset of vomiting at 3:00 this morning. Has had several episodes. Blood sugars read high at home. Patient sleepy but responsive O follow commands. Patient denies any belly pain.      Past Medical History:  Diagnosis Date  . Diabetes mellitus without complication (HCC)   . High cholesterol     There are no active problems to display for this patient.   Past Surgical History:  Procedure Laterality Date  . INCISION AND DRAINAGE BREAST ABSCESS      OB History    No data available       Home Medications    Prior to Admission medications   Medication Sig Start Date End Date Taking? Authorizing Provider  acetaminophen (TYLENOL) 325 MG tablet Take 650 mg by mouth every 6 (six) hours as needed.    Historical Provider, MD  insulin aspart (NOVOLOG) 100 UNIT/ML injection Inject into the skin 3 (three) times daily before meals.    Historical Provider, MD  insulin detemir (LEVEMIR) 100 UNIT/ML injection Inject into the skin at bedtime.    Historical Provider, MD  lisinopril (PRINIVIL,ZESTRIL) 5 MG tablet Take 5 mg by mouth daily.    Historical Provider, MD  ondansetron (ZOFRAN ODT) 8 MG disintegrating tablet Take 1 tablet (8 mg total) by mouth every 8 (eight) hours as needed for nausea or vomiting. 05/13/16   John Molpus, MD  PRAVASTATIN SODIUM PO Take by mouth.    Historical Provider, MD    Family History History reviewed. No pertinent family history.  Social History Social History  Substance Use Topics  . Smoking status: Never Smoker  . Smokeless tobacco: Never Used    . Alcohol use No     Allergies   Patient has no known allergies.   Review of Systems Review of Systems  Constitutional: Negative for fever.  HENT: Negative for congestion.   Eyes: Negative for redness.  Respiratory: Negative for shortness of breath.   Cardiovascular: Negative for chest pain.  Gastrointestinal: Positive for nausea and vomiting. Negative for abdominal pain and diarrhea.  Genitourinary: Negative for dysuria.  Musculoskeletal: Negative for back pain.  Neurological: Negative for headaches.  Hematological: Does not bruise/bleed easily.  Psychiatric/Behavioral: Negative for confusion.     Physical Exam Updated Vital Signs BP (!) 147/94   Pulse (!) 119   Temp 97.9 F (36.6 C) (Oral)   Resp (!) 23   Ht 5\' 2"  (1.575 m)   Wt 56.7 kg   LMP 09/18/2016   SpO2 99%   BMI 22.86 kg/m   Physical Exam  Constitutional: She is oriented to person, place, and time. She appears well-developed and well-nourished. No distress.  HENT:  Head: Normocephalic and atraumatic.  Next membranes are dry.  Eyes: Conjunctivae and EOM are normal. Pupils are equal, round, and reactive to light.  Neck: Neck supple.  Cardiovascular: Normal rate and regular rhythm.   Pulmonary/Chest: Effort normal and breath sounds normal. No respiratory distress.  Abdominal: Soft. Bowel sounds are normal. There is no tenderness.  Musculoskeletal: Normal range of motion. She  exhibits no edema.  Neurological: She is alert and oriented to person, place, and time. No cranial nerve deficit or sensory deficit. She exhibits normal muscle tone. Coordination normal.  Skin: Skin is warm.  Nursing note and vitals reviewed.    ED Treatments / Results  Labs (all labs ordered are listed, but only abnormal results are displayed) Labs Reviewed  BASIC METABOLIC PANEL - Abnormal; Notable for the following:       Result Value   Potassium 5.3 (*)    CO2 12 (*)    Glucose, Bld 258 (*)    BUN 29 (*)    Creatinine,  Ser 1.44 (*)    GFR calc non Af Amer 49 (*)    GFR calc Af Amer 57 (*)    Anion gap >20 (*)    All other components within normal limits  CBC - Abnormal; Notable for the following:    WBC 21.5 (*)    RBC 5.15 (*)    Platelets 462 (*)    All other components within normal limits  CBG MONITORING, ED - Abnormal; Notable for the following:    Glucose-Capillary 289 (*)    All other components within normal limits  I-STAT VENOUS BLOOD GAS, ED - Abnormal; Notable for the following:    pCO2, Ven 23.1 (*)    Bicarbonate 10.8 (*)    Acid-base deficit 14.0 (*)    All other components within normal limits  CBG MONITORING, ED - Abnormal; Notable for the following:    Glucose-Capillary 170 (*)    All other components within normal limits  URINALYSIS, ROUTINE W REFLEX MICROSCOPIC  PREGNANCY, URINE   Results for orders placed or performed during the hospital encounter of 09/18/16  Basic metabolic panel  Result Value Ref Range   Sodium 142 135 - 145 mmol/L   Potassium 5.3 (H) 3.5 - 5.1 mmol/L   Chloride 106 101 - 111 mmol/L   CO2 12 (L) 22 - 32 mmol/L   Glucose, Bld 258 (H) 65 - 99 mg/dL   BUN 29 (H) 6 - 20 mg/dL   Creatinine, Ser 0.451.44 (H) 0.44 - 1.00 mg/dL   Calcium 9.9 8.9 - 40.910.3 mg/dL   GFR calc non Af Amer 49 (L) >60 mL/min   GFR calc Af Amer 57 (L) >60 mL/min   Anion gap >20 (H) 5 - 15  CBC  Result Value Ref Range   WBC 21.5 (H) 4.0 - 10.5 K/uL   RBC 5.15 (H) 3.87 - 5.11 MIL/uL   Hemoglobin 13.5 12.0 - 15.0 g/dL   HCT 81.140.6 91.436.0 - 78.246.0 %   MCV 78.8 78.0 - 100.0 fL   MCH 26.2 26.0 - 34.0 pg   MCHC 33.3 30.0 - 36.0 g/dL   RDW 95.614.7 21.311.5 - 08.615.5 %   Platelets 462 (H) 150 - 400 K/uL  CBG monitoring, ED  Result Value Ref Range   Glucose-Capillary 289 (H) 65 - 99 mg/dL  I-Stat venous blood gas, ED  Result Value Ref Range   pH, Ven 7.275 7.250 - 7.430   pCO2, Ven 23.1 (L) 44.0 - 60.0 mmHg   pO2, Ven 39.0 32.0 - 45.0 mmHg   Bicarbonate 10.8 (L) 20.0 - 28.0 mmol/L   TCO2 11 0 - 100  mmol/L   O2 Saturation 70.0 %   Acid-base deficit 14.0 (H) 0.0 - 2.0 mmol/L   Patient temperature 97.8 F    Collection site IV START    Drawn by Designer, television/film setperator  Sample type VENOUS    Comment NOTIFIED PHYSICIAN   CBG monitoring, ED  Result Value Ref Range   Glucose-Capillary 170 (H) 65 - 99 mg/dL     EKG  EKG Interpretation None       Radiology Dg Chest 2 View  Result Date: 09/18/2016 CLINICAL DATA:  Vomiting. EXAM: CHEST  2 VIEW COMPARISON:  07/07/2017. FINDINGS: The heart size and mediastinal contours are within normal limits. Both lungs are clear. The visualized skeletal structures are unremarkable. IMPRESSION: Normal examination. Electronically Signed   By: Beckie Salts M.D.   On: 09/18/2016 13:24    Procedures Procedures (including critical care time)  CRITICAL CARE Performed by: Vanetta Mulders Total critical care time: 30 minutes Critical care time was exclusive of separately billable procedures and treating other patients. Critical care was necessary to treat or prevent imminent or life-threatening deterioration. Critical care was time spent personally by me on the following activities: development of treatment plan with patient and/or surrogate as well as nursing, discussions with consultants, evaluation of patient's response to treatment, examination of patient, obtaining history from patient or surrogate, ordering and performing treatments and interventions, ordering and review of laboratory studies, ordering and review of radiographic studies, pulse oximetry and re-evaluation of patient's condition.   Medications Ordered in ED Medications  sodium chloride 0.9 % bolus 1,000 mL (0 mLs Intravenous Stopped 09/18/16 1249)    And  sodium chloride 0.9 % bolus 1,000 mL (0 mLs Intravenous Stopped 09/18/16 1334)    And  0.9 %  sodium chloride infusion (not administered)  0.9 %  sodium chloride infusion (not administered)  ondansetron (ZOFRAN) injection 4 mg (4 mg Intravenous  Given 09/18/16 1134)  sodium chloride 0.9 % bolus 2,000 mL (2,000 mLs Intravenous New Bag/Given 09/18/16 1334)     Initial Impression / Assessment and Plan / ED Course  I have reviewed the triage vital signs and the nursing notes.  Pertinent labs & imaging results that were available during my care of the patient were reviewed by me and considered in my medical decision making (see chart for details).    Patient requesting admission to [facility]. They're willing to accept her but there are no beds available currently. The estimated will take 4-6 hours to have beds open. Patient still wants to wait. Total patient that the we can give her some time to see if they can take her there but she cannot stay here overnight.  Patient presented with symptoms consistent with diabetic ketoacidosis. But sugar was elevated but not significant elevated. Does have a significant anion gap. Patient with just some fluids here had blood sugar come down to 170. Will go ahead and give her the rest of her 2 L of normal saline bolus. Had originally ordered a glucose stabilizer but since blood sugars now below 250 normally we do not start that. Instead we'll complete the 2 L bolus then give 5 units of regular insulin and start on D5 normal saline at 125 mL an hour.  Hospitalist at [place] [facility] was Dr. Corrie Mckusick.   Final Clinical Impressions(s) / ED Diagnoses   Final diagnoses:  Diabetic ketoacidosis without coma associated with type 1 diabetes mellitus (HCC)    New Prescriptions New Prescriptions   No medications on file     Vanetta Mulders, MD 09/18/16 1517  Addendum:  Patient notified that we got word from [place] [facility] is a [facility] a bed available. [facility] has agreed [facility]. Will contact the hospitalist for admission.  Patient still tachycardic patient still very drowsy as part of the reason why urine drug screen was ordered. Patient's blood sugar showing  signs of improvement after the boluses. We'll start to D5 normal saline as well as given 5 units of regular insulin. And also feed her. Patient still based on her marked anion gap will continue and still required in ED admission.    Vanetta Mulders, MD 09/18/16 [phone]

## 2016-09-18 NOTE — Progress Notes (Signed)
Pharmacy Antibiotic Follow-up Note  Tricia Boone is a 26 y.o. year-old female admitted on 09/18/2016.  The patient is currently on day 1 of Vancomycin & Zosyn for sepsis.  [facility], leukocytosis, elevated SCr.  Assessment/Plan: Vancomycin 1gm x1 in ED, then 500mg   IV every 12 hours.  Goal trough 15-20 mcg/mL. Zosyn 3.375g IV q8h (4 hour infusion).  Temp (24hrs), Avg:98.3 F (36.8 C), Min:97.9 F (36.6 C), Max:98.6 F (37 C)   Recent Labs Lab 09/18/16 1120  WBC 21.5*    Recent Labs Lab 09/18/16 1120  CREATININE 1.44*   Estimated Creatinine Clearance: 46.4 mL/min (A) (by C-G formula based on SCr of 1.44 mg/dL (H)).    No Known Allergies  Antimicrobials this admission: 3/17 Vancomycin >>  3/17 Zosyn >>   Levels/dose changes this admission:  Microbiology results: ordered BCx:  ordered UCx:    Thank you for allowing pharmacy to be a part of this patient's care.  Otho BellowsGreen, Quinzell Malcomb L PharmD Pager [phone] 09/18/2016, 7:46 PM

## 2016-09-18 NOTE — Progress Notes (Signed)
Assumed care of patient in room 1433. [facility] on stretcher pushed by ambulance personnel. Arrived in stable condition. No orders on patient upon arrival. Dr. Blake DivineAkula paged for orders. Dr. Blake DivineAkula called and asked this writer to page admitting MD. MD paged at [phone]. Awaiting response. VWilliams,rn.

## 2016-09-18 NOTE — ED Notes (Signed)
Pt encouraged again to eat some crackers

## 2016-09-19 ENCOUNTER — Encounter (HOSPITAL_COMMUNITY): Payer: Self-pay | Admitting: Radiology

## 2016-09-19 ENCOUNTER — Inpatient Hospital Stay (HOSPITAL_COMMUNITY): Payer: Self-pay

## 2016-09-19 DIAGNOSIS — G43A1 Cyclical vomiting, intractable: Secondary | ICD-10-CM

## 2016-09-19 DIAGNOSIS — R109 Unspecified abdominal pain: Secondary | ICD-10-CM

## 2016-09-19 DIAGNOSIS — K209 Esophagitis, unspecified: Secondary | ICD-10-CM

## 2016-09-19 LAB — GLUCOSE, CAPILLARY
GLUCOSE-CAPILLARY: 65 mg/dL (ref 65–99)
GLUCOSE-CAPILLARY: 295 mg/dL — AB (ref 65–99)
GLUCOSE-CAPILLARY: 226 mg/dL — AB (ref 65–99)
Glucose-Capillary: 122 mg/dL — ABNORMAL HIGH (ref 65–99)
Glucose-Capillary: 123 mg/dL — ABNORMAL HIGH (ref 65–99)
Glucose-Capillary: 123 mg/dL — ABNORMAL HIGH (ref 65–99)
Glucose-Capillary: 151 mg/dL — ABNORMAL HIGH (ref 65–99)
Glucose-Capillary: 63 mg/dL — ABNORMAL LOW (ref 65–99)
Glucose-Capillary: 69 mg/dL (ref 65–99)

## 2016-09-19 LAB — BASIC METABOLIC PANEL
Anion gap: 8 (ref 5–15)
Anion gap: 10 (ref 5–15)
BUN: 16 mg/dL (ref 6–20)
BUN: 11 mg/dL (ref 6–20)
CALCIUM: 7.8 mg/dL — AB (ref 8.9–10.3)
CALCIUM: 7.2 mg/dL — AB (ref 8.9–10.3)
CO2: 22 mmol/L (ref 22–32)
CO2: 19 mmol/L — ABNORMAL LOW (ref 22–32)
CREATININE: 1.17 mg/dL — AB (ref 0.44–1.00)
Chloride: 118 mmol/L — ABNORMAL HIGH (ref 101–111)
Chloride: 116 mmol/L — ABNORMAL HIGH (ref 101–111)
Creatinine, Ser: 0.9 mg/dL (ref 0.44–1.00)
GFR calc Af Amer: >60 mL/min (ref >60)
GFR calc non Af Amer: >60 mL/min (ref >60)
GFR calc non Af Amer: >60 mL/min (ref >60)
GLUCOSE: 108 mg/dL — AB (ref 65–99)
Glucose, Bld: 315 mg/dL — ABNORMAL HIGH (ref 65–99)
Potassium: 3.5 mmol/L (ref 3.5–5.1)
Potassium: 3.3 mmol/L — ABNORMAL LOW (ref 3.5–5.1)
Sodium: 148 mmol/L — ABNORMAL HIGH (ref 135–145)
Sodium: 145 mmol/L (ref 135–145)

## 2016-09-19 LAB — CBC WITH DIFFERENTIAL/PLATELET
BASOS PCT: 0 %
Basophils Absolute: 0 10*3/uL (ref 0.0–0.1)
EOS PCT: 0 %
Eosinophils Absolute: 0 10*3/uL (ref 0.0–0.7)
HEMATOCRIT: 29.1 % — AB (ref 36.0–46.0)
HEMOGLOBIN: 9.6 g/dL — AB (ref 12.0–15.0)
LYMPHS PCT: 11 %
Lymphs Abs: 2.6 10*3/uL (ref 0.7–4.0)
MCH: 25.8 pg — ABNORMAL LOW (ref 26.0–34.0)
MCHC: 33 g/dL (ref 30.0–36.0)
MCV: 78.2 fL (ref 78.0–100.0)
MONOS PCT: 8 %
Monocytes Absolute: 1.9 10*3/uL — ABNORMAL HIGH (ref 0.1–1.0)
NEUTROS PCT: 81 %
Neutro Abs: 19 10*3/uL — ABNORMAL HIGH (ref 1.7–7.7)
Platelets: 390 10*3/uL (ref 150–400)
RBC: 3.72 MIL/uL — AB (ref 3.87–5.11)
RDW: 14.2 % (ref 11.5–15.5)
WBC: 23.5 10*3/uL — AB (ref 4.0–10.5)

## 2016-09-19 LAB — COMPREHENSIVE METABOLIC PANEL
ALK PHOS: 81 U/L (ref 38–126)
ALT: 55 U/L — AB (ref 14–54)
AST: 36 U/L (ref 15–41)
Albumin: 2.9 g/dL — ABNORMAL LOW (ref 3.5–5.0)
Anion gap: 8 (ref 5–15)
BUN: 16 mg/dL (ref 6–20)
CALCIUM: 7.6 mg/dL — AB (ref 8.9–10.3)
CHLORIDE: 117 mmol/L — AB (ref 101–111)
CO2: 20 mmol/L — AB (ref 22–32)
CREATININE: 0.83 mg/dL (ref 0.44–1.00)
Glucose, Bld: 134 mg/dL — ABNORMAL HIGH (ref 65–99)
Potassium: 3.7 mmol/L (ref 3.5–5.1)
SODIUM: 145 mmol/L (ref 135–145)
Total Bilirubin: 0.5 mg/dL (ref 0.3–1.2)
Total Protein: 6.3 g/dL — ABNORMAL LOW (ref 6.5–8.1)

## 2016-09-19 LAB — PREGNANCY, URINE: PREG TEST UR: NEGATIVE

## 2016-09-19 LAB — PROCALCITONIN: Procalcitonin: 2.16 ng/mL

## 2016-09-19 LAB — RAPID URINE DRUG SCREEN, HOSP PERFORMED
Amphetamines: NOT DETECTED
BARBITURATES: NOT DETECTED
BENZODIAZEPINES: NOT DETECTED
COCAINE: NOT DETECTED
Opiates: POSITIVE — AB
TETRAHYDROCANNABINOL: NOT DETECTED

## 2016-09-19 LAB — URINALYSIS, COMPLETE (UACMP) WITH MICROSCOPIC
BILIRUBIN URINE: NEGATIVE
Bacteria, UA: NONE SEEN
KETONES UR: 20 mg/dL — AB
LEUKOCYTES UA: NEGATIVE
Nitrite: NEGATIVE
Protein, ur: 30 mg/dL — AB
SPECIFIC GRAVITY, URINE: 1.018 (ref 1.005–1.030)
pH: 5 (ref 5.0–8.0)

## 2016-09-19 LAB — LACTIC ACID, PLASMA
LACTIC ACID, VENOUS: 4.6 mmol/L — AB (ref 0.5–1.9)
Lactic Acid, Venous: 2 mmol/L (ref 0.5–1.9)

## 2016-09-19 LAB — APTT: APTT: 20 s — AB (ref 24–36)

## 2016-09-19 LAB — PROTIME-INR
INR: 1.05
Prothrombin Time: 13.7 seconds (ref 11.4–15.2)

## 2016-09-19 LAB — INFLUENZA PANEL BY PCR (TYPE A & B)
Influenza A By PCR: NEGATIVE
Influenza B By PCR: NEGATIVE

## 2016-09-19 MED ORDER — IOPAMIDOL (ISOVUE-300) INJECTION 61%
15.0000 mL | Freq: Once | INTRAVENOUS | Status: DC | PRN
  Administered 2016-09-19: 15 mL via ORAL
  Filled 2016-09-19: qty 30

## 2016-09-19 MED ORDER — DEXTROSE 50 % IV SOLN
1.0000 | Freq: Once | INTRAVENOUS | Status: AC
  Administered 2016-09-19: 50 mL via INTRAVENOUS

## 2016-09-19 MED ORDER — DEXTROSE 50 % IV SOLN
INTRAVENOUS | Status: AC
  Filled 2016-09-19: qty 50

## 2016-09-19 MED ORDER — PROMETHAZINE HCL 25 MG/ML IJ SOLN
12.5000 mg | Freq: Four times a day (QID) | INTRAMUSCULAR | Status: DC | PRN
  Administered 2016-09-19 – 2016-09-22 (×5): 12.5 mg via INTRAVENOUS
  Filled 2016-09-19 (×6): qty 1

## 2016-09-19 MED ORDER — PROCHLORPERAZINE EDISYLATE 5 MG/ML IJ SOLN
10.0000 mg | Freq: Four times a day (QID) | INTRAMUSCULAR | Status: DC | PRN
  Administered 2016-09-19 – 2016-09-20 (×4): 10 mg via INTRAVENOUS
  Filled 2016-09-19 (×4): qty 2

## 2016-09-19 MED ORDER — SODIUM CHLORIDE 0.9 % IV BOLUS (SEPSIS)
500.0000 mL | Freq: Once | INTRAVENOUS | Status: AC
  Administered 2016-09-19: 500 mL via INTRAVENOUS

## 2016-09-19 MED ORDER — METOCLOPRAMIDE HCL 5 MG/ML IJ SOLN
10.0000 mg | Freq: Three times a day (TID) | INTRAMUSCULAR | Status: DC
  Administered 2016-09-19 – 2016-09-24 (×14): 10 mg via INTRAVENOUS
  Filled 2016-09-19 (×16): qty 2

## 2016-09-19 MED ORDER — SODIUM CHLORIDE 0.45 % IV SOLN
INTRAVENOUS | Status: DC
  Administered 2016-09-18 – 2016-09-19 (×2): via INTRAVENOUS

## 2016-09-19 MED ORDER — IOPAMIDOL (ISOVUE-300) INJECTION 61%
INTRAVENOUS | Status: AC
Start: 2016-09-19 — End: 2016-09-19
  Administered 2016-09-19: 100 mL
  Filled 2016-09-19: qty 100

## 2016-09-19 MED ORDER — DEXTROSE 50 % IV SOLN
25.0000 mL | Freq: Once | INTRAVENOUS | Status: AC
  Administered 2016-09-19: 25 mL via INTRAVENOUS
  Filled 2016-09-19: qty 50

## 2016-09-19 MED ORDER — GLUCOSE 40 % PO GEL
ORAL | Status: AC
  Filled 2016-09-19: qty 1

## 2016-09-19 MED ORDER — PHENOL 1.4 % MT LIQD
1.0000 | OROMUCOSAL | Status: DC | PRN
  Filled 2016-09-19: qty 177

## 2016-09-19 MED ORDER — DEXTROSE-NACL 5-0.45 % IV SOLN
INTRAVENOUS | Status: DC
  Administered 2016-09-19 – 2016-09-23 (×6): via INTRAVENOUS

## 2016-09-19 NOTE — Progress Notes (Signed)
Pt is having her menstral cycle. Started on the 13th.

## 2016-09-19 NOTE — Progress Notes (Signed)
PROGRESS NOTE    Tricia GrayLakeyvia A Goldblatt  WUJ:[id number] DOB: 1991/05/12 DOA: 09/18/2016 PCP: No PCP Per Patient  Brief Narrative:  Tricia Boone is a 26 y.o. female with medical history significant for type 1 diabetes mellitus, presenting in [facility] where she was seen for malaise, lethargy, generalized abdominal discomfort, and vomiting. Patient reports that she was in her usual state of health until last night when she noted the insidious development of malaise, lethargy, and generalized abdominal discomfort with recurrent nonbloody nonbilious vomiting. Of note, she reports running out of syringes and not using any insulin yesterday for this reason. She denies recent fevers or chills, denies cough or dyspnea, and denies any chest pain or palpitations. Patient also denies any significant use of alcohol or recent use of illicit substances. She denies recent fall or trauma and denies headache, change in vision or hearing, or focal numbness or weakness. She describes her abdominal pain as, moderate in intensity, generalized, with no alleviating or exacerbating factors identified, associated with nausea and vomiting, but no diarrhea. She also denies melena or hematochezia. There's been no recent long distance travel or sick contacts. Admitted and found to have a possible Esophagitis, Colitis, and less likely Pyelonephritis. Blood Sugars were uncontrolled but after intervention GAP closed. Patient still actively vomiting.   Assessment & Plan:   Principal Problem:   DKA (diabetic ketoacidoses) (HCC) Active Problems:   Metabolic acidosis   Acute encephalopathy   Type 1 diabetes mellitus (HCC)   SIRS (systemic inflammatory response syndrome) (HCC)   Renal insufficiency   Kidney disease   Intractable vomiting  Sepsis 2/2 to suspected Esophagitis, Colitis, less likely Pyelonephritis -S/p 4 Liters of NS and now on Maintenance Fluid with D5W 1/2 NS at 125 mL/hr -CT Scan  of Abdomen/Pelvis showed Heterogenous perfusion of the Right Kidney concerning for Pyelonephritis, however given Urinalysis findings it is less Likely; Scan also showed extensive scarring in the Left Kidney likely 2/2 to prior Episodes of pyelonephritis. -CT scan also showed Scattered areas of mural thickening in the colon, most evident in the proximal descending colon where there is a small amount of adjacent free fluid, concerning for colitis. -Marked Thickening of the Distal Esophagus was also incompletely evaluated but suggestive of esophagitis -WBC went from 21.5 -> 23.5 and patient has low grade temperature of 99.6 -C/w Broad Spectrum Abx with IV Vancomycin and Zosyn -Infuenza A/B Negative; CXR shows normal examination with the heart size and mediastinal contours being wnl -Blood Cx x2 and Urine Cx Pending -Patient on Several Antiemetics -Lactic Acid Level was elevated at 2.0 -Lipase Pending but CT Abd/Pelvis didn't show any Pancreatic Abnormalities  -C/w Clear Liquid Diet as Tolerated  Intractable Nausea/Vomiting likely 2/2 to Above -Patient Positive for Opiates -C/w Metoclopramide 10 mg IV q8h, Zofran po/IV 4 mg q6hprn, Compazine 10 mg IV q6hprn, and Promethazine 12.5 mg IV q6hprn -If continues to Vomit will consider NG Placement and Suction  Hepatic Steatosis -Noted on CT Scan -Obtain CMP in AM  Suspected DKA, type 1 DM, improved - [facility] with malaise, lethargy, vomiting after going a day without insulin  - She was found to have metabolic acidosis with elevated AG, ketones not yet checked  - She was treated with aggressive fluid-resuscitation and 5 units of IV Novolin [facility] and CBG's came down to 100-range  - Monitor on telemetry given associated electrolyte derangements, continue IVF, continue antiemetics, and advance diet as tolerated (on Clears) -C/w Levemir  20 units sq Daily and Sensitive Novolog SSI q4h -CBG's ranging from 63-295; Last Anion Gap was 8 -IF  remains elevated may need to go on Glucostabilizer if unable to eat  -Obtain HbA1c  Acute encephalopathy, improved - Was noted to be lethargic and confused at [facility]  - Confusion seems to have cleared by arrival at [facility], but she remains somnolent, possibly from DKA  - UDS only positive for Opiods, sepsis workup underway  - Likely a metabolic encephalopathy in setting of marked electrolyte derangements   AKI, improving - SCr is 1.44 on admission, with most recent priors in 0.8-1.2 range  - She is markedly dehydrated on admission, was given 4 liters NS at the outside hospital  - Improved BUN/Cr at 22/108 - Continue IVF, Avoid nephrotoxins, repeat chem panel    Hyperlipidemia -Obtain Lipid Panel  DVT prophylaxis: Enoxaparin 40 mg sq q24h Code Status: FULL CODE Family Communication: No Family present at bedside Disposition Plan: Remain Inpatient  Consultants:   None  Procedures:   CT Abd/Pelvis  Antimicrobials:  Anti-infectives    Start     Dose/Rate Route Frequency Ordered Stop   09/19/16 1000  vancomycin (VANCOCIN) 500 mg in sodium chloride 0.9 % 100 mL IVPB     500 mg 100 mL/hr over 60 Minutes Intravenous Every 12 hours 09/18/16 1952     09/19/16 0400  piperacillin-tazobactam (ZOSYN) IVPB 3.375 g     3.375 g 12.5 mL/hr over 240 Minutes Intravenous Every 8 hours 09/18/16 1952     09/18/16 1945  piperacillin-tazobactam (ZOSYN) IVPB 3.375 g     3.375 g 100 mL/hr over 30 Minutes Intravenous  Once 09/18/16 1933 09/18/16 2116   09/18/16 1945  vancomycin (VANCOCIN) IVPB 1000 mg/200 mL premix     1,000 mg 200 mL/hr over 60 Minutes Intravenous  Once 09/18/16 1933 09/18/16 2200     Subjective: Seen and examined at bedside and was extremely nauseous and vomiting. Appeared ill. No CP or SOB and difficult to converse she was that nauseous.   Objective: Vitals:   09/18/16 1714 09/18/16 1815 09/18/16 2010 09/19/16 0407  BP: (!) 150/98 (!) 147/96 (!) 138/95 134/90  Pulse: (!) 129  (!) 117  (!) 107  Resp: (!) 24 (!) 21  20  Temp:  98.6 F (37 C) 98.1 F (36.7 C) 98.1 F (36.7 C)  TempSrc:  Oral Oral Oral  SpO2: 100% 100%  100%  Weight:  58.5 kg (128 lb 15.5 oz)  58.5 kg (128 lb 15.5 oz)  Height:  5\' 2"  (1.575 m)      Intake/Output Summary (Last 24 hours) at 09/19/16 1524 Last data filed at 09/19/16 0704  Gross per 24 hour  Intake          1833.33 ml  Output              350 ml  Net          1483.33 ml   Filed Weights   09/18/16 1115 09/18/16 1815 09/19/16 0407  Weight: 56.7 kg (125 lb) 58.5 kg (128 lb 15.5 oz) 58.5 kg (128 lb 15.5 oz)   Examination: Physical Exam:  Constitutional: Ill appearing 26 year old who looks older than stated age and appears uncomfortable being nauseous Eyes: Lids and conjunctivae normal, sclerae anicteric  ENMT: External Ears, Nose appear normal. Grossly normal hearing.  Neck: Appears normal, supple, no cervical masses, normal ROM, no appreciable thyromegaly, no JVD Respiratory: Diminished to auscultation bilaterally, no wheezing, rales, rhonchi  or crackles. Normal respiratory effort and patient is not tachypenic. No accessory muscle use.  Cardiovascular: RRR, no murmurs / rubs / gallops. S1 and S2 auscultated. No extremity edema.   Abdomen: Soft, tender to palpate, non-distended. No masses palpated. No appreciable hepatosplenomegaly. Bowel sounds positive x4.  GU: Deferred. Musculoskeletal: No clubbing / cyanosis of digits/nails. No joint deformity upper and lower extremities.  Skin: No rashes, lesions, ulcers. No induration; Warm and dry.  Neurologic: CN 2-12 grossly intact with no focal deficits. Romberg sign cerebellar reflexes not assessed.  Psychiatric: Anxious mood and appropriate affect.   Data Reviewed: I have personally reviewed following labs and imaging studies  CBC:  Recent Labs Lab 09/18/16 1120 09/19/16 0214  WBC 21.5* 23.5*  NEUTROABS  --  19.0*  HGB 13.5 9.6*  HCT 40.6 29.1*  MCV 78.8 78.2  PLT 462*  390   Basic Metabolic Panel:  Recent Labs Lab 09/18/16 1120 09/19/16 0010 09/19/16 0214  NA 142 145 148*  K 5.3* 3.7 3.5  CL 106 117* 118*  CO2 12* 20* 22  GLUCOSE 258* 134* 108*  BUN 29* 16 16  CREATININE 1.44* 0.83 0.90  CALCIUM 9.9 7.6* 7.8*   GFR: Estimated Creatinine Clearance: 74.3 mL/min (by C-G formula based on SCr of 0.9 mg/dL). Liver Function Tests:  Recent Labs Lab 09/19/16 0010  AST 36  ALT 55*  ALKPHOS 81  BILITOT 0.5  PROT 6.3*  ALBUMIN 2.9*   No results for input(s): LIPASE, AMYLASE in the last 168 hours. No results for input(s): AMMONIA in the last 168 hours. Coagulation Profile:  Recent Labs Lab 09/19/16 0010  INR 1.05   Cardiac Enzymes: No results for input(s): CKTOTAL, CKMB, CKMBINDEX, TROPONINI in the last 168 hours. BNP (last 3 results) No results for input(s): PROBNP in the last 8760 hours. HbA1C: No results for input(s): HGBA1C in the last 72 hours. CBG:  Recent Labs Lab 09/19/16 0507 09/19/16 0744 09/19/16 0943 09/19/16 1109 09/19/16 1221  GLUCAP 122* 63* 69 123* 151*   Lipid Profile: No results for input(s): CHOL, HDL, LDLCALC, TRIG, CHOLHDL, LDLDIRECT in the last 72 hours. Thyroid Function Tests: No results for input(s): TSH, T4TOTAL, FREET4, T3FREE, THYROIDAB in the last 72 hours. Anemia Panel: No results for input(s): VITAMINB12, FOLATE, FERRITIN, TIBC, IRON, RETICCTPCT in the last 72 hours. Sepsis Labs:  Recent Labs Lab 09/18/16 2002 09/19/16 0010 09/19/16 0214  PROCALCITON  --  2.16  --   LATICACIDVEN 1.6  --  2.0*    No results found for this or any previous visit (from the past 240 hour(s)).  Radiology Studies: Dg Chest 2 View  Result Date: 09/18/2016 CLINICAL DATA:  Vomiting. EXAM: CHEST  2 VIEW COMPARISON:  07/07/2017. FINDINGS: The heart size and mediastinal contours are within normal limits. Both lungs are clear. The visualized skeletal structures are unremarkable. IMPRESSION: Normal examination.  Electronically Signed   By: Beckie Salts M.D.   On: 09/18/2016 13:24   Scheduled Meds: . enoxaparin (LOVENOX) injection  40 mg Subcutaneous Q24H  . insulin aspart  0-9 Units Subcutaneous Q4H  . insulin detemir  20 Units Subcutaneous QHS  . iopamidol      . metoCLOPramide (REGLAN) injection  10 mg Intravenous Q8H  . piperacillin-tazobactam (ZOSYN)  IV  3.375 g Intravenous Q8H  . sodium chloride flush  3 mL Intravenous Q12H  . vancomycin  500 mg Intravenous Q12H   Continuous Infusions: . dextrose 5 % and 0.45% NaCl 125 mL/hr at 09/19/16 1045  LOS: 1 day   Merlene Laughter, DO [facility] Pager [phone]  If 7PM-7AM, please contact night-coverage [email or web address] Password [facility] 09/19/2016, 3:24 PM

## 2016-09-19 NOTE — Progress Notes (Signed)
Pt BG 65 at 0404. On call notified. 1/2 amp D50 ordered.

## 2016-09-19 NOTE — Progress Notes (Signed)
CRITICAL VALUE ALERT  Critical value received: Lactic Acid 2.0  Date of notification:  09/19/16  Time of notification:  0300 Critical value read back:Yes.    Nurse who received alert:  Vella RedheadLisa Ryatt Corsino  MD notified (1st page): A. Amin  Time of first page:  0306  MD notified (2nd page):  Time of second page:  Responding MD:  A. Amin  Time MD responded:  0330

## 2016-09-20 LAB — COMPREHENSIVE METABOLIC PANEL
ALT: 45 U/L (ref 14–54)
AST: 43 U/L — AB (ref 15–41)
Albumin: 2.7 g/dL — ABNORMAL LOW (ref 3.5–5.0)
Alkaline Phosphatase: 70 U/L (ref 38–126)
Anion gap: 8 (ref 5–15)
BUN: 10 mg/dL (ref 6–20)
CHLORIDE: 120 mmol/L — AB (ref 101–111)
CO2: 22 mmol/L (ref 22–32)
Calcium: 7.5 mg/dL — ABNORMAL LOW (ref 8.9–10.3)
Creatinine, Ser: 0.93 mg/dL (ref 0.44–1.00)
GFR calc non Af Amer: >60 mL/min (ref >60)
Glucose, Bld: 83 mg/dL (ref 65–99)
POTASSIUM: 3 mmol/L — AB (ref 3.5–5.1)
SODIUM: 150 mmol/L — AB (ref 135–145)
Total Bilirubin: 0.5 mg/dL (ref 0.3–1.2)
Total Protein: 6.1 g/dL — ABNORMAL LOW (ref 6.5–8.1)

## 2016-09-20 LAB — GLUCOSE, CAPILLARY
GLUCOSE-CAPILLARY: 134 mg/dL — AB (ref 65–99)
GLUCOSE-CAPILLARY: 92 mg/dL (ref 65–99)
Glucose-Capillary: 78 mg/dL (ref 65–99)
Glucose-Capillary: 166 mg/dL — ABNORMAL HIGH (ref 65–99)
Glucose-Capillary: 168 mg/dL — ABNORMAL HIGH (ref 65–99)
Glucose-Capillary: 160 mg/dL — ABNORMAL HIGH (ref 65–99)

## 2016-09-20 LAB — CBC WITH DIFFERENTIAL/PLATELET
BASOS ABS: 0 10*3/uL (ref 0.0–0.1)
Basophils Relative: 0 %
EOS ABS: 0.1 10*3/uL (ref 0.0–0.7)
EOS PCT: 0 %
HCT: 28.5 % — ABNORMAL LOW (ref 36.0–46.0)
Hemoglobin: 9.1 g/dL — ABNORMAL LOW (ref 12.0–15.0)
Lymphocytes Relative: 16 %
Lymphs Abs: 2.5 10*3/uL (ref 0.7–4.0)
MCH: 25.6 pg — AB (ref 26.0–34.0)
MCHC: 31.9 g/dL (ref 30.0–36.0)
MCV: 80.3 fL (ref 78.0–100.0)
Monocytes Absolute: 1.5 10*3/uL — ABNORMAL HIGH (ref 0.1–1.0)
Monocytes Relative: 10 %
Neutro Abs: 11.7 10*3/uL — ABNORMAL HIGH (ref 1.7–7.7)
Neutrophils Relative %: 74 %
PLATELETS: 364 10*3/uL (ref 150–400)
RBC: 3.55 MIL/uL — AB (ref 3.87–5.11)
RDW: 14.5 % (ref 11.5–15.5)
WBC: 15.7 10*3/uL — AB (ref 4.0–10.5)

## 2016-09-20 LAB — URINE CULTURE

## 2016-09-20 LAB — PHOSPHORUS: PHOSPHORUS: 2.7 mg/dL (ref 2.5–4.6)

## 2016-09-20 LAB — MAGNESIUM: Magnesium: 2 mg/dL (ref 1.7–2.4)

## 2016-09-20 LAB — LACTIC ACID, PLASMA
Lactic Acid, Venous: 4.8 mmol/L (ref 0.5–1.9)
Lactic Acid, Venous: 2.8 mmol/L (ref 0.5–1.9)

## 2016-09-20 MED ORDER — POTASSIUM CHLORIDE 10 MEQ/100ML IV SOLN
10.0000 meq | INTRAVENOUS | Status: AC
  Administered 2016-09-20 (×3): 10 meq via INTRAVENOUS
  Filled 2016-09-20 (×3): qty 100

## 2016-09-20 MED ORDER — SODIUM CHLORIDE 0.9 % IV BOLUS (SEPSIS)
500.0000 mL | Freq: Once | INTRAVENOUS | Status: AC
  Administered 2016-09-20: 500 mL via INTRAVENOUS

## 2016-09-20 MED ORDER — SODIUM CHLORIDE 0.9 % IV SOLN: 30.0000 meq | Freq: Once | INTRAVENOUS | Status: DC

## 2016-09-20 NOTE — Progress Notes (Signed)
PROGRESS NOTE    Tricia Boone  ZOX:[id number] DOB: 07-Apr-1991 DOA: 09/18/2016 PCP: No PCP Per Patient  Brief Narrative:  Tricia Boone is a 26 y.o. female with medical history significant for type 1 diabetes mellitus, presenting in transfer from [facility] where she was seen for malaise, lethargy, generalized abdominal discomfort, and vomiting. Patient reports that she was in her usual state of health until last night when she noted the insidious development of malaise, lethargy, and generalized abdominal discomfort with recurrent nonbloody nonbilious vomiting. Of note, she reports running out of syringes and not using any insulin yesterday for this reason. She denies recent fevers or chills, denies cough or dyspnea, and denies any chest pain or palpitations. Patient also denies any significant use of alcohol or recent use of illicit substances. She denies recent fall or trauma and denies headache, change in vision or hearing, or focal numbness or weakness. She describes her abdominal pain as, moderate in intensity, generalized, with no alleviating or exacerbating factors identified, associated with nausea and vomiting, but no diarrhea. She also denies melena or hematochezia. There's been no recent long distance travel or sick contacts. Admitted and found to have a possible Esophagitis, Colitis, and less likely Pyelonephritis. Blood Sugars were uncontrolled but after intervention GAP closed. Patients Vomiting improved but still nauseous.    Assessment & Plan:   Principal Problem:   DKA (diabetic ketoacidoses) (HCC) Active Problems:   Metabolic acidosis   Acute encephalopathy   Type 1 diabetes mellitus (HCC)   SIRS (systemic inflammatory response syndrome) (HCC)   Renal insufficiency   Kidney disease   Intractable vomiting  Sepsis 2/2 to suspected Esophagitis, Colitis, less likely Pyelonephritis -S/p 4 Liters of NS and now on Maintenance Fluid with D5W 1/2 NS at 75  mL/hr -CT Scan of Abdomen/Pelvis showed Heterogenous perfusion of the Right Kidney concerning for Pyelonephritis, however given Urinalysis findings it is less Likely; Scan also showed extensive scarring in the Left Kidney likely 2/2 to prior Episodes of pyelonephritis. -CT scan also showed Scattered areas of mural thickening in the colon, most evident in the proximal descending colon where there is a small amount of adjacent free fluid, concerning for colitis. -Marked Thickening of the Distal Esophagus was also incompletely evaluated but suggestive of esophagitis -WBC went from 21.5 -> 23.5 -> 15.7 and patient has low grade temperature of 99.6 -C/w Broad Spectrum Abx with IV Vancomycin and Zosyn -Infuenza A/B Negative; CXR shows normal examination with the heart size and mediastinal contours being wnl -Blood Cx x2 showed NGTD and Urine Cx Showed Multiple Species present -Patient on Several Antiemetics -Lactic Acid Level was elevated at 2.0 and went to 4.8; Improving to 2.8 now -Lipase Pending but CT Abd/Pelvis didn't show any Pancreatic Abnormalities  -C/w Clear Liquid Diet as Tolerated  Intractable Nausea/Vomiting likely 2/2 to Above, improved  -Patient Positive for Opiates -C/w Metoclopramide 10 mg IV q8h, Zofran po/IV 4 mg q6hprn, Compazine 10 mg IV q6hprn, and Promethazine 12.5 mg IV q6hprn -If continues to Vomit will consider NG Placement and Suction  Hepatic Steatosis -Noted on CT Scan -Obtain CMP in AM  Suspected DKA, type 1 DM, improved - Pt presented to [facility] with malaise, lethargy, vomiting after going a day without insulin  - She was found to have metabolic acidosis with elevated AG, ketones not yet checked  - She was treated with aggressive fluid-resuscitation and 5 units of IV Novolin at [facility] and CBG's came down to 100-range  -  Monitor on telemetry given associated electrolyte derangements, continue IVF, continue antiemetics, and advance diet as tolerated (on Clears) -C/w  Levemir 20 units sq Daily and Sensitive Novolog SSI q4h -CBG's ranging from 78-168; Last Anion Gap was 8 -IF remains elevated may need to go on Glucostabilizer if unable to eat  -Obtain HbA1c; Last one in system was 14.3  Acute encephalopathy, improved - Was noted to be lethargic and confused at [facility]  - Confusion seems to have cleared by arrival at [facility], but she remains somnolent, possibly from DKA  - UDS only positive for Opiods, sepsis workup underway  - Likely a metabolic encephalopathy in setting of marked electrolyte derangements   AKI, improving - SCr is 1.44 on admission, with most recent priors in 0.8-1.2 range  - She is markedly dehydrated on admission, was given 4 liters NS at the outside hospital  - Improved BUN/Cr at 10/0.93 from 22/1.08 - Continue IVF, Avoid nephrotoxins, repeat chem panel    Hyperlipidemia -Obtain Lipid Panel  Hypokalemia -Patient's K+ was 3.0 -Replete with IV KCl 30 mEQ x 3 doses -Repeat CMP in AM  DVT prophylaxis: Enoxaparin 40 mg sq q24h Code Status: FULL CODE Family Communication: No Family present at bedside Disposition Plan: Remain Inpatient  Consultants:   None  Procedures:   CT Abd/Pelvis  Antimicrobials:  Anti-infectives    Start     Dose/Rate Route Frequency Ordered Stop   09/19/16 1000  vancomycin (VANCOCIN) 500 mg in sodium chloride 0.9 % 100 mL IVPB     500 mg 100 mL/hr over 60 Minutes Intravenous Every 12 hours 09/18/16 1952     09/19/16 0400  piperacillin-tazobactam (ZOSYN) IVPB 3.375 g     3.375 g 12.5 mL/hr over 240 Minutes Intravenous Every 8 hours 09/18/16 1952     09/18/16 1945  piperacillin-tazobactam (ZOSYN) IVPB 3.375 g     3.375 g 100 mL/hr over 30 Minutes Intravenous  Once 09/18/16 1933 09/18/16 2116   09/18/16 1945  vancomycin (VANCOCIN) IVPB 1000 mg/200 mL premix     1,000 mg 200 mL/hr over 60 Minutes Intravenous  Once 09/18/16 1933 09/18/16 2200     Subjective: Seen and examined at bedside and stated  she felt better but was still nauseous. No CP or SOB. No urinary symptoms and no diarrhea.   Objective: Vitals:   09/19/16 1549 09/19/16 2038 09/20/16 0438 09/20/16 1533  BP: 120/76 (!) 149/98 (!) 147/95 (!) 147/95  Pulse: (!) 106 (!) 104 98 (!) 113  Resp: 20 20 20 20   Temp: 99.6 F (37.6 C) 99 F (37.2 C) 98.7 F (37.1 C) 98.7 F (37.1 C)  TempSrc: Oral Oral Oral Oral  SpO2: 100% 100% 99% 100%  Weight:      Height:        Intake/Output Summary (Last 24 hours) at 09/20/16 2007 Last data filed at 09/20/16 1800  Gross per 24 hour  Intake          3263.33 ml  Output              300 ml  Net          2963.33 ml   Filed Weights   09/18/16 1115 09/18/16 1815 09/19/16 0407  Weight: 56.7 kg (125 lb) 58.5 kg (128 lb 15.5 oz) 58.5 kg (128 lb 15.5 oz)   Examination: Physical Exam:  Constitutional:  26 year old who looks older than stated age and appears better than yesterday  Eyes: Lids and conjunctivae normal, sclerae anicteric  ENMT: External Ears, Nose appear normal. Grossly normal hearing.  Neck: Appears normal, supple, no cervical masses, normal ROM, no appreciable thyromegaly, no JVD Respiratory: Diminished to auscultation bilaterally, no wheezing, rales, rhonchi or crackles. Normal respiratory effort and patient is not tachypenic. No accessory muscle use.  Cardiovascular: Tachycardic Rate and Rhythm, no murmurs / rubs / gallops. S1 and S2 auscultated. No extremity edema.   Abdomen: Soft, tender to palpate, non-distended. No masses palpated. No appreciable hepatosplenomegaly. Bowel sounds positive x4.  GU: Deferred. Musculoskeletal: No clubbing / cyanosis of digits/nails. No joint deformity upper and lower extremities.  Skin: No rashes, lesions, ulcers. No induration; Warm and dry.  Neurologic: CN 2-12 grossly intact with no focal deficits. Romberg sign cerebellar reflexes not assessed.  Psychiatric: Anxious mood and appropriate affect.   Data Reviewed: I have personally  reviewed following labs and imaging studies  CBC:  Recent Labs Lab 09/18/16 1120 09/19/16 0214 09/20/16 0511  WBC 21.5* 23.5* 15.7*  NEUTROABS  --  19.0* 11.7*  HGB 13.5 9.6* 9.1*  HCT 40.6 29.1* 28.5*  MCV 78.8 78.2 80.3  PLT 462* 390 364   Basic Metabolic Panel:  Recent Labs Lab 09/18/16 1120 09/19/16 0010 09/19/16 0214 09/19/16 1912 09/20/16 0511  NA 142 145 148* 145 150*  K 5.3* 3.7 3.5 3.3* 3.0*  CL 106 117* 118* 116* 120*  CO2 12* 20* 22 19* 22  GLUCOSE 258* 134* 108* 315* 83  BUN 29* 16 16 11 10   CREATININE 1.44* 0.83 0.90 1.17* 0.93  CALCIUM 9.9 7.6* 7.8* 7.2* 7.5*  MG  --   --   --   --  2.0  PHOS  --   --   --   --  2.7   GFR: Estimated Creatinine Clearance: 71.9 mL/min (by C-G formula based on SCr of 0.93 mg/dL). Liver Function Tests:  Recent Labs Lab 09/19/16 0010 09/20/16 0511  AST 36 43*  ALT 55* 45  ALKPHOS 81 70  BILITOT 0.5 0.5  PROT 6.3* 6.1*  ALBUMIN 2.9* 2.7*   No results for input(s): LIPASE, AMYLASE in the last 168 hours. No results for input(s): AMMONIA in the last 168 hours. Coagulation Profile:  Recent Labs Lab 09/19/16 0010  INR 1.05   Cardiac Enzymes: No results for input(s): CKTOTAL, CKMB, CKMBINDEX, TROPONINI in the last 168 hours. BNP (last 3 results) No results for input(s): PROBNP in the last 8760 hours. HbA1C: No results for input(s): HGBA1C in the last 72 hours. CBG:  Recent Labs Lab 09/20/16 0431 09/20/16 0732 09/20/16 1146 09/20/16 1721 09/20/16 1959  GLUCAP 92 78 166* 168* 160*   Lipid Profile: No results for input(s): CHOL, HDL, LDLCALC, TRIG, CHOLHDL, LDLDIRECT in the last 72 hours. Thyroid Function Tests: No results for input(s): TSH, T4TOTAL, FREET4, T3FREE, THYROIDAB in the last 72 hours. Anemia Panel: No results for input(s): VITAMINB12, FOLATE, FERRITIN, TIBC, IRON, RETICCTPCT in the last 72 hours. Sepsis Labs:  Recent Labs Lab 09/19/16 0010 09/19/16 0214 09/19/16 1912 09/20/16 0020  09/20/16 0511  PROCALCITON 2.16  --   --   --   --   LATICACIDVEN  --  2.0* 4.6* 4.8* 2.8*    Recent Results (from the past 240 hour(s))  Culture, blood (x 2)     Status: None (Preliminary result)   Collection Time: 09/18/16  8:02 PM  Result Value Ref Range Status   Specimen Description BLOOD RIGHT ANTECUBITAL  Final   Special Requests IN PEDIATRIC BOTTLE 1CC  Final  Culture   Final    NO GROWTH 1 DAY Performed at [facility], [address], [place], [place] [address]    Report Status PENDING  Incomplete  Culture, blood (x 2)     Status: None (Preliminary result)   Collection Time: 09/18/16  8:02 PM  Result Value Ref Range Status   Specimen Description BLOOD BLOOD RIGHT FOREARM  Final   Special Requests IN PEDIATRIC BOTTLE 1CC  Final   Culture   Final    NO GROWTH 1 DAY Performed at [facility], [address], [place], [place] [address]    Report Status PENDING  Incomplete  Urine culture     Status: Abnormal   Collection Time: 09/19/16  7:18 AM  Result Value Ref Range Status   Specimen Description URINE, CLEAN CATCH  Final   Special Requests NONE  Final   Culture MULTIPLE SPECIES PRESENT, SUGGEST RECOLLECTION (A)  Final   Report Status 09/20/2016 FINAL  Final    Radiology Studies: Ct Abdomen Pelvis W Contrast  Result Date: 09/19/2016 CLINICAL DATA:  26 year old female with history of abdominal pain and uncontrolled nausea and vomiting. EXAM: CT ABDOMEN AND PELVIS WITH CONTRAST TECHNIQUE: Multidetector CT imaging of the abdomen and pelvis was performed using the standard protocol following bolus administration of intravenous contrast. CONTRAST:  ISOVUE-300 IOPAMIDOL (ISOVUE-300) INJECTION 61% COMPARISON:  CT the abdomen and pelvis 11/11/2012. FINDINGS: Lower chest: Scattered areas of scarring in the left lung base. Marked thickening of the distal esophagus. Hepatobiliary: Diffuse low attenuation throughout the hepatic parenchyma, concerning for hepatic  steatosis. No definite cystic or solid hepatic lesions. No intra or extrahepatic biliary ductal dilatation. Gallbladder is normal in appearance. Pancreas: No pancreatic mass. No pancreatic ductal dilatation. No pancreatic or peripancreatic fluid or inflammatory changes. Spleen: Unremarkable. Adrenals/Urinary Tract: Multifocal cortical thinning in the left kidney, compatible with scarring from prior infection or infarctions. Heterogeneous areas of perfusion, most apparent in the right kidney, concerning for pyelonephritis. No hydroureteronephrosis. The urinary bladder is normal in appearance. Bilateral adrenal glands are normal in appearance. Stomach/Bowel: The appearance of the stomach is normal. There is no pathologic dilatation of small bowel or colon. Bowel wall thickening is evident, particularly in the colon in the proximal descending colon where there appears to be some hyperenhancement of the mucosa, and a small amount of adjacent fluid in the left pericolic gutter, best appreciated on axial image 22 of series 2. The appendix is not confidently identified and may be surgically absent. Regardless, there are no inflammatory changes noted adjacent to the cecum to suggest the presence of an acute appendicitis at this time. Vascular/Lymphatic: No significant atherosclerotic disease, aneurysm or dissection identified in the abdominal or pelvic vasculature. No lymphadenopathy noted in the abdomen or pelvis. Reproductive: Uterus and ovaries are unremarkable in appearance. Other: Trace volume of free fluid, most evident in the left pericolic gutter and in the cul-de-sac. No larger volume of ascites. No pneumoperitoneum. Musculoskeletal: There are no aggressive appearing lytic or blastic lesions noted in the visualized portions of the skeleton. IMPRESSION: 1. Heterogeneous perfusion of the right kidney. The appearance of this is concerning for potential pyelonephritis, and correlation with urinalysis is recommended.  There is extensive scarring in the left kidney, likely secondary to prior episodes of pyelonephritis. 2. Scattered areas of mural thickening in the colon, most evident in the proximal descending colon where there is a small amount of adjacent free fluid, concerning for colitis. 3. There is also marked  thickening of the distal esophagus, which is incompletely evaluated, but suggestive of a esophagitis. 4. Hepatic steatosis. Electronically Signed   By: Trudie Reedaniel  Entrikin M.D.   On: 09/19/2016 17:26   Scheduled Meds: . enoxaparin (LOVENOX) injection  40 mg Subcutaneous Q24H  . insulin aspart  0-9 Units Subcutaneous Q4H  . insulin detemir  20 Units Subcutaneous QHS  . metoCLOPramide (REGLAN) injection  10 mg Intravenous Q8H  . piperacillin-tazobactam (ZOSYN)  IV  3.375 g Intravenous Q8H  . sodium chloride flush  3 mL Intravenous Q12H  . vancomycin  500 mg Intravenous Q12H   Continuous Infusions: . dextrose 5 % and 0.45% NaCl 75 mL/hr at 09/20/16 0957    LOS: 2 days   Merlene Laughtermair Latif Magdalina Whitehead, DO [facility] Pager [phone]  If 7PM-7AM, please contact night-coverage [email or web address] Password [email or web address] 09/20/2016, 8:07 PM

## 2016-09-20 NOTE — Progress Notes (Signed)
09/20/16  0900  Patient's mother called requesting information. Her mom was informed that I am not able to give patient information over the phone. Mom is not healthcare power of attorney. Advised mom to call patient.

## 2016-09-20 NOTE — Progress Notes (Signed)
CRITICAL VALUE ALERT  Critical value received:  Lactic Acid 2.8  Date of notification:  09/20/16  Time of notification0730  Critical value read back:yes   Nurse who received alert:  Karie SchwalbeJoanne Elener Custodio  MD notified (1st page): yes  Time of first page:  [phone]

## 2016-09-21 LAB — CBC WITH DIFFERENTIAL/PLATELET
BASOS ABS: 0 10*3/uL (ref 0.0–0.1)
Basophils Relative: 0 %
EOS PCT: 1 %
Eosinophils Absolute: 0.1 10*3/uL (ref 0.0–0.7)
HCT: 29.1 % — ABNORMAL LOW (ref 36.0–46.0)
Hemoglobin: 9.4 g/dL — ABNORMAL LOW (ref 12.0–15.0)
LYMPHS PCT: 24 %
Lymphs Abs: 2.7 10*3/uL (ref 0.7–4.0)
MCH: 25.9 pg — AB (ref 26.0–34.0)
MCHC: 32.3 g/dL (ref 30.0–36.0)
MCV: 80.2 fL (ref 78.0–100.0)
MONO ABS: 1 10*3/uL (ref 0.1–1.0)
MONOS PCT: 9 %
Neutro Abs: 7.3 10*3/uL (ref 1.7–7.7)
Neutrophils Relative %: 66 %
PLATELETS: 332 10*3/uL (ref 150–400)
RBC: 3.63 MIL/uL — ABNORMAL LOW (ref 3.87–5.11)
RDW: 14.4 % (ref 11.5–15.5)
WBC: 11.2 10*3/uL — ABNORMAL HIGH (ref 4.0–10.5)

## 2016-09-21 LAB — GLUCOSE, CAPILLARY
GLUCOSE-CAPILLARY: 98 mg/dL (ref 65–99)
GLUCOSE-CAPILLARY: 186 mg/dL — AB (ref 65–99)
Glucose-Capillary: 104 mg/dL — ABNORMAL HIGH (ref 65–99)
Glucose-Capillary: 148 mg/dL — ABNORMAL HIGH (ref 65–99)
Glucose-Capillary: 171 mg/dL — ABNORMAL HIGH (ref 65–99)
Glucose-Capillary: 242 mg/dL — ABNORMAL HIGH (ref 65–99)

## 2016-09-21 LAB — COMPREHENSIVE METABOLIC PANEL
ALT: 56 U/L — ABNORMAL HIGH (ref 14–54)
ANION GAP: 8 (ref 5–15)
AST: 81 U/L — AB (ref 15–41)
Albumin: 2.7 g/dL — ABNORMAL LOW (ref 3.5–5.0)
Alkaline Phosphatase: 74 U/L (ref 38–126)
BILIRUBIN TOTAL: 0.8 mg/dL (ref 0.3–1.2)
BUN: 7 mg/dL (ref 6–20)
CO2: 22 mmol/L (ref 22–32)
Calcium: 7.3 mg/dL — ABNORMAL LOW (ref 8.9–10.3)
Chloride: 116 mmol/L — ABNORMAL HIGH (ref 101–111)
Creatinine, Ser: 0.84 mg/dL (ref 0.44–1.00)
GFR calc Af Amer: >60 mL/min (ref >60)
GFR calc non Af Amer: >60 mL/min (ref >60)
GLUCOSE: 112 mg/dL — AB (ref 65–99)
POTASSIUM: 2.8 mmol/L — AB (ref 3.5–5.1)
Sodium: 146 mmol/L — ABNORMAL HIGH (ref 135–145)
TOTAL PROTEIN: 6.1 g/dL — AB (ref 6.5–8.1)

## 2016-09-21 LAB — PHOSPHORUS: PHOSPHORUS: 2.4 mg/dL — AB (ref 2.5–4.6)

## 2016-09-21 LAB — MAGNESIUM: Magnesium: 1.8 mg/dL (ref 1.7–2.4)

## 2016-09-21 LAB — VANCOMYCIN, TROUGH: Vancomycin Tr: 7 ug/mL — ABNORMAL LOW (ref 15–20)

## 2016-09-21 MED ORDER — VANCOMYCIN HCL IN DEXTROSE 750-5 MG/150ML-% IV SOLN
750.0000 mg | Freq: Two times a day (BID) | INTRAVENOUS | Status: DC
  Administered 2016-09-22: 750 mg via INTRAVENOUS
  Filled 2016-09-21: qty 150

## 2016-09-21 MED ORDER — POTASSIUM CHLORIDE 10 MEQ/100ML IV SOLN
10.0000 meq | INTRAVENOUS | Status: AC
  Administered 2016-09-21 (×3): 10 meq via INTRAVENOUS
  Filled 2016-09-21 (×3): qty 100

## 2016-09-21 MED ORDER — MAGNESIUM SULFATE 2 GM/50ML IV SOLN
2.0000 g | Freq: Once | INTRAVENOUS | Status: AC
  Administered 2016-09-21: 2 g via INTRAVENOUS
  Filled 2016-09-21: qty 50

## 2016-09-21 MED ORDER — POTASSIUM CHLORIDE 10 MEQ/100ML IV SOLN
10.0000 meq | INTRAVENOUS | Status: DC
  Administered 2016-09-21: 10 meq via INTRAVENOUS
  Filled 2016-09-21 (×6): qty 100

## 2016-09-21 MED ORDER — DEXTROSE 5 % IV SOLN
15.0000 mmol | Freq: Once | INTRAVENOUS | Status: AC
  Administered 2016-09-21: 15 mmol via INTRAVENOUS
  Filled 2016-09-21: qty 5

## 2016-09-21 NOTE — Progress Notes (Signed)
Pharmacy Antibiotic Follow-up Note  Tricia Boone is a 26 y.o. year-old female admitted on 09/18/2016.  The patient is currently on day 4 of Vancomycin & Zosyn for sepsis.  [facility], leukocytosis, elevated SCr.   Today, 09/21/2016: CT scan w/ possible esophagitis, colitis, less likely pyelonephritis. WBC down to 11.2, creatinine down to 0.84 and she may need an increased dose of vancomycin.   Assessment/Plan: Check vancomycin trough tonight to evaluate current regimen of vancomycin  500 mg IV q12h - may need to increase dose with improved renal function. Continue zosyn 3.375 q8h, EI infusion Consider stopping vancomycin.  If colitis is major concern could change to cipro/flagyl  Temp (24hrs), Avg:98.8 F (37.1 C), Min:98.7 F (37.1 C), Max:99 F (37.2 C)   Recent Labs Lab 09/18/16 1120 09/19/16 0214 09/20/16 0511 09/21/16 0624  WBC 21.5* 23.5* 15.7* 11.2*     Recent Labs Lab 09/19/16 0010 09/19/16 0214 09/19/16 1912 09/20/16 0511 09/21/16 0624  CREATININE 0.83 0.90 1.17* 0.93 0.84   Estimated Creatinine Clearance: 79.6 mL/min (by C-G formula based on SCr of 0.84 mg/dL).    No Known Allergies  Antimicrobials this admission: 3/17 Vancomycin >>  3/17 Zosyn >>   Levels/dose changes this admission: 3/20 VT =  Microbiology results: 3/18 BCx: ngtd 3/17 UCx: F 3/18 flu neg   Thank you for allowing pharmacy to be a part of this patient's care.  Herby AbrahamMichelle T. Huckleberry Martinson, Pharm.D. [phone] 09/21/2016 2:35 PM

## 2016-09-21 NOTE — Progress Notes (Signed)
Pharmacy Antibiotic Follow-up Note  Tricia Boone is a 26 y.o. year-old female admitted on 09/18/2016.  The patient is currently on day 4 of Vancomycin & Zosyn for sepsis.  [facility], leukocytosis, elevated SCr.   Today, 09/21/2016: CT scan w/ possible esophagitis, colitis, less likely pyelonephritis. WBC down to 11.2, creatinine down to 0.84 and she may need an increased dose of vancomycin.   Assessment/Plan: 2207 VT=7, will increase Vancomycin to 750 mg IV q12h Continue zosyn 3.375 q8h, EI infusion Consider stopping vancomycin.  If colitis is major concern could change to cipro/flagyl  Temp (24hrs), Avg:98.8 F (37.1 C), Min:98.6 F (37 C), Max:99 F (37.2 C)   Recent Labs Lab 09/18/16 1120 09/19/16 0214 09/20/16 0511 09/21/16 0624  WBC 21.5* 23.5* 15.7* 11.2*     Recent Labs Lab 09/19/16 0010 09/19/16 0214 09/19/16 1912 09/20/16 0511 09/21/16 0624  CREATININE 0.83 0.90 1.17* 0.93 0.84   Estimated Creatinine Clearance: 79.6 mL/min (by C-G formula based on SCr of 0.84 mg/dL).    No Known Allergies  Antimicrobials this admission: 3/17 Vancomycin >>  3/17 Zosyn >>   Levels/dose changes this admission: 3/20 VT = 7 mg/L on 500 mg IV q12h Will increase Vancomycin to 750 mg IV q12h next dose 3/21 0800 Microbiology results: 3/18 BCx: ngtd 3/17 UCx: F 3/18 flu neg   Thank you for allowing pharmacy to be a part of this patient's care.  Lorenza EvangelistGreen, Eustacio Ellen R [phone] 09/21/2016 11:37 PM

## 2016-09-21 NOTE — Progress Notes (Signed)
PROGRESS NOTE    Tricia Boone  ZOX:[id number] DOB: 04-Jan-1991 DOA: 09/18/2016 PCP: No PCP Per Patient  Brief Narrative:  Tricia Boone is a 26 y.o. female with medical history significant for type 1 diabetes mellitus, presenting in transfer from [facility] where she was seen for malaise, lethargy, generalized abdominal discomfort, and vomiting. Patient reports that she was in her usual state of health until last night when she noted the insidious development of malaise, lethargy, and generalized abdominal discomfort with recurrent nonbloody nonbilious vomiting. Of note, she reports running out of syringes and not using any insulin yesterday for this reason. She denies recent fevers or chills, denies cough or dyspnea, and denies any chest pain or palpitations. Patient also denies any significant use of alcohol or recent use of illicit substances. She denies recent fall or trauma and denies headache, change in vision or hearing, or focal numbness or weakness. She describes her abdominal pain as, moderate in intensity, generalized, with no alleviating or exacerbating factors identified, associated with nausea and vomiting, but no diarrhea. She also denies melena or hematochezia. There's been no recent long distance travel or sick contacts. Admitted and found to have a possible Esophagitis, Colitis, and less likely Pyelonephritis. Blood Sugars were uncontrolled but after intervention GAP closed. Patients Vomiting improved significantly but still complaining of being nauseous today.    Assessment & Plan:   Principal Problem:   DKA (diabetic ketoacidoses) (HCC) Active Problems:   Metabolic acidosis   Acute encephalopathy   Type 1 diabetes mellitus (HCC)   SIRS (systemic inflammatory response syndrome) (HCC)   Renal insufficiency   Kidney disease   Intractable vomiting  Sepsis 2/2 to suspected Esophagitis, Colitis, less likely Pyelonephritis -S/p 4 Liters of NS and now on  Maintenance Fluid with D5W 1/2 NS at 75 mL/hr -CT Scan of Abdomen/Pelvis showed Heterogenous perfusion of the Right Kidney concerning for Pyelonephritis, however given Urinalysis findings it is less Likely; Scan also showed extensive scarring in the Left Kidney likely 2/2 to prior Episodes of pyelonephritis. -CT scan also showed Scattered areas of mural thickening in the colon, most evident in the proximal descending colon where there is a small amount of adjacent free fluid, concerning for colitis. -Marked Thickening of the Distal Esophagus was also incompletely evaluated but suggestive of esophagitis -WBC went from 21.5 -> 23.5 -> 15.7 -> 11.2 and patient has low grade temperature of 99.6 -C/w Broad Spectrum Abx with IV Vancomycin and Zosyn -Infuenza A/B Negative; CXR shows normal examination with the heart size and mediastinal contours being wnl -Blood Cx x2 showed NGTD at 2 days and Urine Cx Showed Multiple Species present -Patient on Several Antiemetics; Abdominal Pain Improved  -Lactic Acid Level was elevated at 2.0 and went to 4.8; Improving to 2.8 now -Lipase Pending but CT Abd/Pelvis didn't show any Pancreatic Abnormalities  -C/w Clear Liquid Diet as Tolerated  Intractable Nausea/Vomiting likely 2/2 to Above, improved  -Patient Positive for Opiates -C/w Metoclopramide 10 mg IV q8h, Zofran po/IV 4 mg q6hprn, Compazine 10 mg IV q6hprn, and Promethazine 12.5 mg IV q6hprn -If continues to Vomit will consider NG Placement and Suction; Still nauseous but not as much  Hepatic Steatosis and elevated AST -Check Acute Hepatitis Panel and HIV -Noted on CT Scan -Obtain CMP in AM  Suspected DKA, type 1 DM, improved - Pt presented to [facility] with malaise, lethargy, vomiting after going a day without insulin  - She was found to have metabolic acidosis with elevated  AG, ketones not yet checked  - She was treated with aggressive fluid-resuscitation and 5 units of IV Novolin at [facility] and CBG's came down  to 100-range  - Monitor on telemetry given associated electrolyte derangements, continue IVF, continue antiemetics, and advance diet as tolerated (on Clears) -C/w Levemir 20 units sq Daily and Sensitive Novolog SSI q4h -CBG's ranging from 98-171; Last Anion Gap was 8 -IF remains elevated may need to go on Glucostabilizer if unable to eat  -Obtain HbA1c; Last one in system was 14.3  Acute encephalopathy, improved - Was noted to be lethargic and confused at [facility]  - Confusion seems to have cleared by arrival at [facility], but she remains somnolent, possibly from DKA  - UDS only positive for Opiods, sepsis workup underway  - Likely a metabolic encephalopathy in setting of marked electrolyte derangements   AKI, improving - SCr is 1.44 on admission, with most recent priors in 0.8-1.2 range  - She is markedly dehydrated on admission, was given 4 liters NS at the outside hospital  - Improved BUN/Cr to 7/0.84 - Continue IVF, Avoid nephrotoxins, repeat chem panel    Hyperlipidemia -Obtain Lipid Panel  Hypokalemia -Patient's K+ was 2,8 -Replete with IV KCl 40 mEQ and KPhos -Repeat CMP in AM  Hypophosphetemia  -Patient's Phos level was 2.4 -Replete with Potassium Phosphate 15 mmol x 1 dose -Repeat Phos in AM   DVT prophylaxis: Enoxaparin 40 mg sq q24h Code Status: FULL CODE Family Communication: No Family present at bedside Disposition Plan: Remain Inpatient  Consultants:   None  Procedures:   CT Abd/Pelvis  Antimicrobials:  Anti-infectives    Start     Dose/Rate Route Frequency Ordered Stop   09/19/16 1000  vancomycin (VANCOCIN) 500 mg in sodium chloride 0.9 % 100 mL IVPB     500 mg 100 mL/hr over 60 Minutes Intravenous Every 12 hours 09/18/16 1952     09/19/16 0400  piperacillin-tazobactam (ZOSYN) IVPB 3.375 g     3.375 g 12.5 mL/hr over 240 Minutes Intravenous Every 8 hours 09/18/16 1952     09/18/16 1945  piperacillin-tazobactam (ZOSYN) IVPB 3.375 g     3.375 g 100 mL/hr  over 30 Minutes Intravenous  Once 09/18/16 1933 09/18/16 2116   09/18/16 1945  vancomycin (VANCOCIN) IVPB 1000 mg/200 mL premix     1,000 mg 200 mL/hr over 60 Minutes Intravenous  Once 09/18/16 1933 09/18/16 2200     Subjective: Seen and examined at bedside and states she is still nauseous but not as much. No CP or SOB. No trouble swallowing.   Objective: Vitals:   09/20/16 1533 09/20/16 2213 09/21/16 0437 09/21/16 1555  BP: (!) 147/95 (!) 152/92 (!) 149/88 (!) 141/89  Pulse: (!) 113 (!) 106 91 (!) 107  Resp: 20 20 20 20   Temp: 98.7 F (37.1 C) 98.7 F (37.1 C) 99 F (37.2 C) 98.6 F (37 C)  TempSrc: Oral Oral Oral Oral  SpO2: 100% 100% 99% 97%  Weight:      Height:        Intake/Output Summary (Last 24 hours) at 09/21/16 1759 Last data filed at 09/21/16 1755  Gross per 24 hour  Intake             1675 ml  Output             1200 ml  Net              475 ml   Filed Weights   09/18/16  1115 09/18/16 1815 09/19/16 0407  Weight: 56.7 kg (125 lb) 58.5 kg (128 lb 15.5 oz) 58.5 kg (128 lb 15.5 oz)   Examination: Physical Exam:  Constitutional:  26 year old who looks older than stated age and appears better than yesterday  Eyes: Lids and conjunctivae normal, sclerae anicteric  ENMT: External Ears, Nose appear normal. Grossly normal hearing. Mouth with no lesions noted.   Neck: Appears normal, supple, no cervical masses, normal ROM, no appreciable thyromegaly, no JVD Respiratory: Diminished to auscultation bilaterally, no wheezing, rales, rhonchi or crackles. Normal respiratory effort and patient is not tachypenic. No accessory muscle use.  Cardiovascular: Tachycardic Rate and Rhythm, no murmurs / rubs / gallops. S1 and S2 auscultated. No extremity edema.   Abdomen: Soft, tender to palpate, non-distended. No masses palpated. No appreciable hepatosplenomegaly. Bowel sounds positive x4.  GU: Deferred. Musculoskeletal: No clubbing / cyanosis of digits/nails. No joint deformity  upper and lower extremities.  Skin: No rashes, lesions, ulcers. No induration; Warm and dry.  Neurologic: CN 2-12 grossly intact with no focal deficits. Romberg sign cerebellar reflexes not assessed.  Psychiatric: Anxious mood and appropriate affect.   Data Reviewed: I have personally reviewed following labs and imaging studies  CBC:  Recent Labs Lab 09/18/16 1120 09/19/16 0214 09/20/16 0511 09/21/16 0624  WBC 21.5* 23.5* 15.7* 11.2*  NEUTROABS  --  19.0* 11.7* 7.3  HGB 13.5 9.6* 9.1* 9.4*  HCT 40.6 29.1* 28.5* 29.1*  MCV 78.8 78.2 80.3 80.2  PLT 462* 390 364 332   Basic Metabolic Panel:  Recent Labs Lab 09/19/16 0010 09/19/16 0214 09/19/16 1912 09/20/16 0511 09/21/16 0624  NA 145 148* 145 150* 146*  K 3.7 3.5 3.3* 3.0* 2.8*  CL 117* 118* 116* 120* 116*  CO2 20* 22 19* 22 22  GLUCOSE 134* 108* 315* 83 112*  BUN 16 16 11 10 7   CREATININE 0.83 0.90 1.17* 0.93 0.84  CALCIUM 7.6* 7.8* 7.2* 7.5* 7.3*  MG  --   --   --  2.0 1.8  PHOS  --   --   --  2.7 2.4*   GFR: Estimated Creatinine Clearance: 79.6 mL/min (by C-G formula based on SCr of 0.84 mg/dL). Liver Function Tests:  Recent Labs Lab 09/19/16 0010 09/20/16 0511 09/21/16 0624  AST 36 43* 81*  ALT 55* 45 56*  ALKPHOS 81 70 74  BILITOT 0.5 0.5 0.8  PROT 6.3* 6.1* 6.1*  ALBUMIN 2.9* 2.7* 2.7*   No results for input(s): LIPASE, AMYLASE in the last 168 hours. No results for input(s): AMMONIA in the last 168 hours. Coagulation Profile:  Recent Labs Lab 09/19/16 0010  INR 1.05   Cardiac Enzymes: No results for input(s): CKTOTAL, CKMB, CKMBINDEX, TROPONINI in the last 168 hours. BNP (last 3 results) No results for input(s): PROBNP in the last 8760 hours. HbA1C: No results for input(s): HGBA1C in the last 72 hours. CBG:  Recent Labs Lab 09/21/16 0040 09/21/16 0412 09/21/16 0806 09/21/16 1205 09/21/16 1552  GLUCAP 148* 104* 98 186* 171*   Lipid Profile: No results for input(s): CHOL, HDL,  LDLCALC, TRIG, CHOLHDL, LDLDIRECT in the last 72 hours. Thyroid Function Tests: No results for input(s): TSH, T4TOTAL, FREET4, T3FREE, THYROIDAB in the last 72 hours. Anemia Panel: No results for input(s): VITAMINB12, FOLATE, FERRITIN, TIBC, IRON, RETICCTPCT in the last 72 hours. Sepsis Labs:  Recent Labs Lab 09/19/16 0010 09/19/16 0214 09/19/16 1912 09/20/16 0020 09/20/16 0511  PROCALCITON 2.16  --   --   --   --  LATICACIDVEN  --  2.0* 4.6* 4.8* 2.8*    Recent Results (from the past 240 hour(s))  Culture, blood (x 2)     Status: None (Preliminary result)   Collection Time: 09/18/16  8:02 PM  Result Value Ref Range Status   Specimen Description BLOOD RIGHT ANTECUBITAL  Final   Special Requests IN PEDIATRIC BOTTLE 1CC  Final   Culture   Final    NO GROWTH 2 DAYS Performed at [facility], [address] [place], [place] [address]    Report Status PENDING  Incomplete  Culture, blood (x 2)     Status: None (Preliminary result)   Collection Time: 09/18/16  8:02 PM  Result Value Ref Range Status   Specimen Description BLOOD BLOOD RIGHT FOREARM  Final   Special Requests IN PEDIATRIC BOTTLE 1CC  Final   Culture   Final    NO GROWTH 2 DAYS Performed at [facility], [address] [place], [place] [address]    Report Status PENDING  Incomplete  Urine culture     Status: Abnormal   Collection Time: 09/19/16  7:18 AM  Result Value Ref Range Status   Specimen Description URINE, CLEAN CATCH  Final   Special Requests NONE  Final   Culture MULTIPLE SPECIES PRESENT, SUGGEST RECOLLECTION (A)  Final   Report Status 09/20/2016 FINAL  Final    Radiology Studies: No results found. Scheduled Meds: . enoxaparin (LOVENOX) injection  40 mg Subcutaneous Q24H  . insulin aspart  0-9 Units Subcutaneous Q4H  . insulin detemir  20 Units Subcutaneous QHS  . metoCLOPramide (REGLAN) injection  10 mg Intravenous Q8H  . piperacillin-tazobactam (ZOSYN)  IV  3.375 g Intravenous Q8H    . potassium phosphate IVPB (mmol)  15 mmol Intravenous Once  . sodium chloride flush  3 mL Intravenous Q12H  . vancomycin  500 mg Intravenous Q12H   Continuous Infusions: . dextrose 5 % and 0.45% NaCl 75 mL/hr at 09/21/16 0946    LOS: 3 days   Merlene Laughtermair Latif Sheikh, DO [facility] Pager [phone]  If 7PM-7AM, please contact night-coverage [email or web address] Password [email or web address] 09/21/2016, 5:59 PM

## 2016-09-21 NOTE — Care Management Note (Signed)
Case Management Note  Patient Details  Name: Tricia Boone MRN: [id number] Date of Birth: Aug 29, 1990  Subjective/Objective:  Pt admitted with DKA                  Action/Plan:Pt plan to discharge home, [facility] with [facility].  Expected Discharge Date:  09/22/16               Expected Discharge Plan:  Home/Self Care  In-House Referral:  NA  Discharge planning Services  NA  Post Acute Care Choice:  NA Choice offered to:  NA  DME Arranged:  N/A DME Agency:  NA  HH Arranged:  NA HH Agency:  NA  Status of Service:  In process, will continue to follow  If discussed at Long Length of Stay Meetings, dates discussed:    Additional CommentsGeni Bers:  Rockford Leinen, RN 09/21/2016, 3:45 PM

## 2016-09-22 DIAGNOSIS — R7989 Other specified abnormal findings of blood chemistry: Secondary | ICD-10-CM

## 2016-09-22 DIAGNOSIS — E0810 Diabetes mellitus due to underlying condition with ketoacidosis without coma: Secondary | ICD-10-CM

## 2016-09-22 LAB — COMPREHENSIVE METABOLIC PANEL
ALBUMIN: 2.4 g/dL — AB (ref 3.5–5.0)
ALK PHOS: 68 U/L (ref 38–126)
ALT: 48 U/L (ref 14–54)
ANION GAP: 8 (ref 5–15)
AST: 62 U/L — AB (ref 15–41)
BILIRUBIN TOTAL: 0.6 mg/dL (ref 0.3–1.2)
BUN: <5 mg/dL — ABNORMAL LOW (ref 6–20)
CALCIUM: 7.1 mg/dL — AB (ref 8.9–10.3)
CO2: 21 mmol/L — ABNORMAL LOW (ref 22–32)
Chloride: 111 mmol/L (ref 101–111)
Creatinine, Ser: 0.79 mg/dL (ref 0.44–1.00)
GFR calc Af Amer: >60 mL/min (ref >60)
GFR calc non Af Amer: >60 mL/min (ref >60)
GLUCOSE: 138 mg/dL — AB (ref 65–99)
Potassium: 3.2 mmol/L — ABNORMAL LOW (ref 3.5–5.1)
Sodium: 140 mmol/L (ref 135–145)
TOTAL PROTEIN: 5.5 g/dL — AB (ref 6.5–8.1)

## 2016-09-22 LAB — GLUCOSE, CAPILLARY
GLUCOSE-CAPILLARY: 136 mg/dL — AB (ref 65–99)
GLUCOSE-CAPILLARY: 227 mg/dL — AB (ref 65–99)
Glucose-Capillary: 130 mg/dL — ABNORMAL HIGH (ref 65–99)
Glucose-Capillary: 155 mg/dL — ABNORMAL HIGH (ref 65–99)
Glucose-Capillary: 187 mg/dL — ABNORMAL HIGH (ref 65–99)
Glucose-Capillary: 191 mg/dL — ABNORMAL HIGH (ref 65–99)
Glucose-Capillary: 221 mg/dL — ABNORMAL HIGH (ref 65–99)

## 2016-09-22 LAB — CBC WITH DIFFERENTIAL/PLATELET
BASOS PCT: 0 %
Basophils Absolute: 0 10*3/uL (ref 0.0–0.1)
Eosinophils Absolute: 0.2 10*3/uL (ref 0.0–0.7)
Eosinophils Relative: 2 %
HEMATOCRIT: 27.2 % — AB (ref 36.0–46.0)
HEMOGLOBIN: 8.6 g/dL — AB (ref 12.0–15.0)
LYMPHS PCT: 29 %
Lymphs Abs: 2.7 10*3/uL (ref 0.7–4.0)
MCH: 25.1 pg — ABNORMAL LOW (ref 26.0–34.0)
MCHC: 31.6 g/dL (ref 30.0–36.0)
MCV: 79.3 fL (ref 78.0–100.0)
MONOS PCT: 8 %
Monocytes Absolute: 0.8 10*3/uL (ref 0.1–1.0)
NEUTROS ABS: 5.5 10*3/uL (ref 1.7–7.7)
Neutrophils Relative %: 61 %
Platelets: 296 10*3/uL (ref 150–400)
RBC: 3.43 MIL/uL — ABNORMAL LOW (ref 3.87–5.11)
RDW: 14.5 % (ref 11.5–15.5)
WBC: 9.1 10*3/uL (ref 4.0–10.5)

## 2016-09-22 LAB — MAGNESIUM: Magnesium: 2 mg/dL (ref 1.7–2.4)

## 2016-09-22 LAB — HEPATITIS PANEL, ACUTE
HCV Ab: <0.1 s/co ratio (ref 0.0–0.9)
HEP B S AG: NEGATIVE
Hep A IgM: NEGATIVE
Hep B C IgM: NEGATIVE

## 2016-09-22 LAB — PHOSPHORUS: Phosphorus: 2.7 mg/dL (ref 2.5–4.6)

## 2016-09-22 MED ORDER — POTASSIUM CHLORIDE CRYS ER 20 MEQ PO TBCR
40.0000 meq | EXTENDED_RELEASE_TABLET | Freq: Once | ORAL | Status: AC
  Administered 2016-09-22: 40 meq via ORAL
  Filled 2016-09-22: qty 2

## 2016-09-22 MED ORDER — FLUCONAZOLE 100 MG PO TABS
100.0000 mg | ORAL_TABLET | Freq: Every day | ORAL | Status: DC
  Administered 2016-09-22 – 2016-09-24 (×3): 100 mg via ORAL
  Filled 2016-09-22 (×3): qty 1

## 2016-09-22 MED ORDER — NYSTATIN 100000 UNIT/ML MT SUSP: 5.0000 mL | Freq: Four times a day (QID) | OROMUCOSAL | Status: DC

## 2016-09-22 MED ORDER — AMOXICILLIN-POT CLAVULANATE 875-125 MG PO TABS
1.0000 | ORAL_TABLET | Freq: Two times a day (BID) | ORAL | Status: DC
  Administered 2016-09-22 – 2016-09-24 (×4): 1 via ORAL
  Filled 2016-09-22 (×4): qty 1

## 2016-09-22 MED ORDER — SENNOSIDES-DOCUSATE SODIUM 8.6-50 MG PO TABS
1.0000 | ORAL_TABLET | Freq: Two times a day (BID) | ORAL | Status: DC
  Administered 2016-09-23 (×2): 1 via ORAL
  Filled 2016-09-22 (×4): qty 1

## 2016-09-22 NOTE — Progress Notes (Signed)
PROGRESS NOTE    Tricia Boone  ZOX:[id number] DOB: 14-Apr-1991 DOA: 09/18/2016 PCP: No PCP Per Patient  Brief Narrative:  Tricia Boone is a 26 y.o. female with medical history significant for type 1 diabetes mellitus, presenting in [facility] where she was seen for malaise, lethargy, generalized abdominal discomfort, and vomiting. Patient reports that she was in her usual state of health until last night when she noted the insidious development of malaise, lethargy, and generalized abdominal discomfort with recurrent nonbloody nonbilious vomiting. Of note, she reports running out of syringes and not using any insulin yesterday for this reason. She denies recent fevers or chills, denies cough or dyspnea, and denies any chest pain or palpitations. Patient also denies any significant use of alcohol or recent use of illicit substances. She denies recent fall or trauma and denies headache, change in vision or hearing, or focal numbness or weakness. She describes her abdominal pain as, moderate in intensity, generalized, with no alleviating or exacerbating factors identified, associated with nausea and vomiting, but no diarrhea. She also denies melena or hematochezia. There's been no recent long distance travel or sick contacts. Admitted and found to have a possible Esophagitis, Colitis, and less likely Pyelonephritis. Blood Sugars were uncontrolled but after intervention GAP closed. Patients Vomiting improved significantly but still complaining of being nauseous today.    Assessment & Plan:   Principal Problem:   DKA (diabetic ketoacidoses) (HCC) Active Problems:   Metabolic acidosis   Acute encephalopathy   Type 1 diabetes mellitus (HCC)   SIRS (systemic inflammatory response syndrome) (HCC)   Renal insufficiency   Kidney disease   Intractable vomiting  Sepsis 2/2 to suspected Esophagitis, Colitis, less likely Pyelonephritis -S/p 4 Liters of NS and now on  Maintenance Fluid with D5W 1/2 NS at 75 mL/hr -CT Scan of Abdomen/Pelvis showed Heterogenous perfusion of the Right Kidney concerning for Pyelonephritis, however given Urinalysis findings it is less Likely; Scan also showed extensive scarring in the Left Kidney likely 2/2 to prior Episodes of pyelonephritis. -CT scan also showed Scattered areas of mural thickening in the colon, most evident in the proximal descending colon where there is a small amount of adjacent free fluid, concerning for colitis. -Marked Thickening of the Distal Esophagus was also incompletely evaluated but suggestive of esophagitis -WBC went from 21.5 -> 23.5 -> 15.7 -> 11.2 and patient has low grade temperature of 99.6 -C/w Broad Spectrum Abx with IV Vancomycin and Zosyn -Infuenza A/B Negative; CXR shows normal examination with the heart size and mediastinal contours being wnl -Blood Cx x2 showed NGTD at 2 days and Urine Cx Showed Multiple Species present -Patient on Several Antiemetics; Abdominal Pain Improved  -Lactic Acid Level was elevated at 2.0 and went to 4.8; Improving to 2.8 now -Lipase Pending but CT Abd/Pelvis didn't show any Pancreatic Abnormalities  -C/w Clear Liquid Diet as Tolerated  Intractable Nausea/Vomiting likely 2/2 to Above, improved  -Patient Positive for Opiates -C/w Metoclopramide 10 mg IV q8h, Zofran po/IV 4 mg q6hprn, Compazine 10 mg IV q6hprn, and Promethazine 12.5 mg IV q6hprn -If continues to Vomit will consider NG Placement and Suction; Still nauseous but not as much  Hepatic Steatosis and elevated AST, Noted on CT Scan -Acute Hepatitis Panel and HIV negative   Suspected DKA, type 1 DM, improved - [facility] with malaise, lethargy, vomiting after going a day without insulin  - She was found to have metabolic acidosis with elevated AG, ketones not  yet checked  - She was treated with aggressive fluid-resuscitation and 5 units of IV Novolin at [facility] and CBG's came down to 100-range    - Monitor on telemetry given associated electrolyte derangements, continue IVF, continue antiemetics, and advance diet as tolerated (on Clears) -C/w Levemir 20 units sq Daily and Sensitive Novolog SSI q4h -CBG's ranging from 98-171; Last Anion Gap was 8 -IF remains elevated may need to go on Glucostabilizer if unable to eat  -Obtain HbA1c; Last one in system was 14.3  Acute encephalopathy, improved - Was noted to be lethargic and confused at [facility]  - Confusion seems to have cleared by arrival at [facility], but she remains somnolent, possibly from DKA  - UDS only positive for Opiods, sepsis workup underway  - Likely a metabolic encephalopathy in setting of marked electrolyte derangements   AKI, improving - SCr is 1.44 on admission, with most recent priors in 0.8-1.2 range  - She is markedly dehydrated on admission, was given 4 liters NS at the outside hospital  - Improved BUN/Cr to 7/0.84 - Continue IVF, Avoid nephrotoxins, repeat chem panel    Hyperlipidemia -Obtain Lipid Panel  Hypokalemia -Patient's K+ was 2,8 -Replete with IV KCl 40 mEQ and KPhos -Repeat CMP in AM  Hypophosphetemia  -Patient's Phos level was 2.4 -Replete with Potassium Phosphate 15 mmol x 1 dose -Repeat Phos in AM   DVT prophylaxis: Enoxaparin 40 mg sq q24h Code Status: FULL CODE Family Communication: No Family present at bedside Disposition Plan: home in 1-2 days  Consultants:   None  Procedures:   CT Abd/Pelvis  Antimicrobials:  Anti-infectives    Start     Dose/Rate Route Frequency Ordered Stop   09/22/16 2200  amoxicillin-clavulanate (AUGMENTIN) 875-125 MG per tablet 1 tablet     1 tablet Oral Every 12 hours 09/22/16 1042     09/22/16 1200  fluconazole (DIFLUCAN) tablet 100 mg     100 mg Oral Daily 09/22/16 1042     09/22/16 0800  vancomycin (VANCOCIN) IVPB 750 mg/150 ml premix  Status:  Discontinued     750 mg 150 mL/hr over 60 Minutes Intravenous Every 12 hours 09/21/16 2337 09/22/16 1035    09/19/16 1000  vancomycin (VANCOCIN) 500 mg in sodium chloride 0.9 % 100 mL IVPB  Status:  Discontinued     500 mg 100 mL/hr over 60 Minutes Intravenous Every 12 hours 09/18/16 1952 09/21/16 2337   09/19/16 0400  piperacillin-tazobactam (ZOSYN) IVPB 3.375 g  Status:  Discontinued     3.375 g 12.5 mL/hr over 240 Minutes Intravenous Every 8 hours 09/18/16 1952 09/22/16 1042   09/18/16 1945  piperacillin-tazobactam (ZOSYN) IVPB 3.375 g     3.375 g 100 mL/hr over 30 Minutes Intravenous  Once 09/18/16 1933 09/18/16 2116   09/18/16 1945  vancomycin (VANCOCIN) IVPB 1000 mg/200 mL premix     1,000 mg 200 mL/hr over 60 Minutes Intravenous  Once 09/18/16 1933 09/18/16 2200     Subjective: Doing better, tolerating clears, still some nausea, no vomiting since yesterday am, family in room  Objective: Vitals:   09/21/16 0437 09/21/16 1555 09/21/16 2054 09/22/16 0422  BP: (!) 149/88 (!) 141/89 (!) 165/104 (!) 160/103  Pulse: 91 (!) 107 98 98  Resp: 20 20 20 20   Temp: 99 F (37.2 C) 98.6 F (37 C) 98.9 F (37.2 C) 99.7 F (37.6 C)  TempSrc: Oral Oral Oral Oral  SpO2: 99% 97% 96% 93%  Weight:    62.8 kg (138  lb 6.4 oz)  Height:        Intake/Output Summary (Last 24 hours) at 09/22/16 1609 Last data filed at 09/22/16 1610  Gross per 24 hour  Intake          2176.25 ml  Output             2000 ml  Net           176.25 ml   Filed Weights   09/18/16 1815 09/19/16 0407 09/22/16 0422  Weight: 58.5 kg (128 lb 15.5 oz) 58.5 kg (128 lb 15.5 oz) 62.8 kg (138 lb 6.4 oz)   Examination: Physical Exam:  Constitutional:  26 year old who looks older than stated age and appears better than yesterday  Eyes: Lids and conjunctivae normal, sclerae anicteric  ENMT: External Ears, Nose appear normal. Grossly normal hearing. Mouth with no lesions noted.   Neck: Appears normal, supple, no cervical masses, normal ROM, no appreciable thyromegaly, no JVD Respiratory: Diminished to auscultation bilaterally,  no wheezing, rales, rhonchi or crackles. Normal respiratory effort and patient is not tachypenic. No accessory muscle use.  Cardiovascular: Tachycardic Rate and Rhythm, no murmurs / rubs / gallops. S1 and S2 auscultated. No extremity edema.   Abdomen: Soft, tender to palpate, non-distended. No masses palpated. No appreciable hepatosplenomegaly. Bowel sounds positive x4.  GU: Deferred. Musculoskeletal: No clubbing / cyanosis of digits/nails. No joint deformity upper and lower extremities.  Skin: No rashes, lesions, ulcers. No induration; Warm and dry.  Neurologic: CN 2-12 grossly intact with no focal deficits. Romberg sign cerebellar reflexes not assessed.  Psychiatric: Anxious mood and appropriate affect.   Data Reviewed: I have personally reviewed following labs and imaging studies  CBC:  Recent Labs Lab 09/18/16 1120 09/19/16 0214 09/20/16 0511 09/21/16 0624 09/22/16 0612  WBC 21.5* 23.5* 15.7* 11.2* 9.1  NEUTROABS  --  19.0* 11.7* 7.3 5.5  HGB 13.5 9.6* 9.1* 9.4* 8.6*  HCT 40.6 29.1* 28.5* 29.1* 27.2*  MCV 78.8 78.2 80.3 80.2 79.3  PLT 462* 390 364 332 296   Basic Metabolic Panel:  Recent Labs Lab 09/19/16 0214 09/19/16 1912 09/20/16 0511 09/21/16 0624 09/22/16 0612  NA 148* 145 150* 146* 140  K 3.5 3.3* 3.0* 2.8* 3.2*  CL 118* 116* 120* 116* 111  CO2 22 19* 22 22 21*  GLUCOSE 108* 315* 83 112* 138*  BUN 16 11 10 7  <5*  CREATININE 0.90 1.17* 0.93 0.84 0.79  CALCIUM 7.8* 7.2* 7.5* 7.3* 7.1*  MG  --   --  2.0 1.8 2.0  PHOS  --   --  2.7 2.4* 2.7   GFR: Estimated Creatinine Clearance: 92 mL/min (by C-G formula based on SCr of 0.79 mg/dL). Liver Function Tests:  Recent Labs Lab 09/19/16 0010 09/20/16 0511 09/21/16 0624 09/22/16 0612  AST 36 43* 81* 62*  ALT 55* 45 56* 48  ALKPHOS 81 70 74 68  BILITOT 0.5 0.5 0.8 0.6  PROT 6.3* 6.1* 6.1* 5.5*  ALBUMIN 2.9* 2.7* 2.7* 2.4*   No results for input(s): LIPASE, AMYLASE in the last 168 hours. No results for  input(s): AMMONIA in the last 168 hours. Coagulation Profile:  Recent Labs Lab 09/19/16 0010  INR 1.05   Cardiac Enzymes: No results for input(s): CKTOTAL, CKMB, CKMBINDEX, TROPONINI in the last 168 hours. BNP (last 3 results) No results for input(s): PROBNP in the last 8760 hours. HbA1C: No results for input(s): HGBA1C in the last 72 hours. CBG:  Recent Labs Lab 09/21/16  2050 09/22/16 0037 09/22/16 0419 09/22/16 0832 09/22/16 1151  GLUCAP 242* 227* 136* 130* 155*   Lipid Profile: No results for input(s): CHOL, HDL, LDLCALC, TRIG, CHOLHDL, LDLDIRECT in the last 72 hours. Thyroid Function Tests: No results for input(s): TSH, T4TOTAL, FREET4, T3FREE, THYROIDAB in the last 72 hours. Anemia Panel: No results for input(s): VITAMINB12, FOLATE, FERRITIN, TIBC, IRON, RETICCTPCT in the last 72 hours. Sepsis Labs:  Recent Labs Lab 09/19/16 0010 09/19/16 0214 09/19/16 1912 09/20/16 0020 09/20/16 0511  PROCALCITON 2.16  --   --   --   --   LATICACIDVEN  --  2.0* 4.6* 4.8* 2.8*    Recent Results (from the past 240 hour(s))  Culture, blood (x 2)     Status: None (Preliminary result)   Collection Time: 09/18/16  8:02 PM  Result Value Ref Range Status   Specimen Description BLOOD RIGHT ANTECUBITAL  Final   Special Requests IN PEDIATRIC BOTTLE 1CC  Final   Culture   Final    NO GROWTH 3 DAYS Performed at [facility], [address], [place], [place] [address]    Report Status PENDING  Incomplete  Culture, blood (x 2)     Status: None (Preliminary result)   Collection Time: 09/18/16  8:02 PM  Result Value Ref Range Status   Specimen Description BLOOD BLOOD RIGHT FOREARM  Final   Special Requests IN PEDIATRIC BOTTLE 1CC  Final   Culture   Final    NO GROWTH 3 DAYS Performed at [facility], [address] [place], [place] [address]    Report Status PENDING  Incomplete  Urine culture     Status: Abnormal   Collection Time: 09/19/16  7:18 AM  Result Value  Ref Range Status   Specimen Description URINE, CLEAN CATCH  Final   Special Requests NONE  Final   Culture MULTIPLE SPECIES PRESENT, SUGGEST RECOLLECTION (A)  Final   Report Status 09/20/2016 FINAL  Final    Radiology Studies: No results found. Scheduled Meds: . amoxicillin-clavulanate  1 tablet Oral Q12H  . enoxaparin (LOVENOX) injection  40 mg Subcutaneous Q24H  . fluconazole  100 mg Oral Daily  . insulin aspart  0-9 Units Subcutaneous Q4H  . insulin detemir  20 Units Subcutaneous QHS  . metoCLOPramide (REGLAN) injection  10 mg Intravenous Q8H  . sodium chloride flush  3 mL Intravenous Q12H   Continuous Infusions: . dextrose 5 % and 0.45% NaCl 75 mL/hr at 09/22/16 0021    LOS: 4 days   Tricia Sigler, MD PhD [facility] Pager [phone]  If 7PM-7AM, please contact night-coverage [email or web address] Password [facility] 09/22/2016, 4:09 PM

## 2016-09-22 NOTE — Progress Notes (Signed)
Report given by ongoing nurse. Agreed with nurse assessment of patient and will cont to monitor.  

## 2016-09-23 DIAGNOSIS — Z794 Long term (current) use of insulin: Secondary | ICD-10-CM

## 2016-09-23 DIAGNOSIS — E119 Type 2 diabetes mellitus without complications: Secondary | ICD-10-CM

## 2016-09-23 LAB — COMPREHENSIVE METABOLIC PANEL
ALBUMIN: 2.8 g/dL — AB (ref 3.5–5.0)
ALK PHOS: 80 U/L (ref 38–126)
ALT: 58 U/L — AB (ref 14–54)
ANION GAP: 10 (ref 5–15)
AST: 89 U/L — ABNORMAL HIGH (ref 15–41)
BILIRUBIN TOTAL: 0.7 mg/dL (ref 0.3–1.2)
BUN: 6 mg/dL (ref 6–20)
CALCIUM: 7.7 mg/dL — AB (ref 8.9–10.3)
CO2: 22 mmol/L (ref 22–32)
CREATININE: 0.94 mg/dL (ref 0.44–1.00)
Chloride: 109 mmol/L (ref 101–111)
GFR calc Af Amer: >60 mL/min (ref >60)
GFR calc non Af Amer: >60 mL/min (ref >60)
GLUCOSE: 135 mg/dL — AB (ref 65–99)
Potassium: 3.1 mmol/L — ABNORMAL LOW (ref 3.5–5.1)
SODIUM: 141 mmol/L (ref 135–145)
TOTAL PROTEIN: 6.8 g/dL (ref 6.5–8.1)

## 2016-09-23 LAB — CBC
HEMATOCRIT: 31.6 % — AB (ref 36.0–46.0)
HEMOGLOBIN: 10.3 g/dL — AB (ref 12.0–15.0)
MCH: 25.9 pg — AB (ref 26.0–34.0)
MCHC: 32.6 g/dL (ref 30.0–36.0)
MCV: 79.4 fL (ref 78.0–100.0)
Platelets: 376 10*3/uL (ref 150–400)
RBC: 3.98 MIL/uL (ref 3.87–5.11)
RDW: 14.4 % (ref 11.5–15.5)
WBC: 11.6 10*3/uL — ABNORMAL HIGH (ref 4.0–10.5)

## 2016-09-23 LAB — GLUCOSE, CAPILLARY
GLUCOSE-CAPILLARY: 111 mg/dL — AB (ref 65–99)
GLUCOSE-CAPILLARY: 148 mg/dL — AB (ref 65–99)
GLUCOSE-CAPILLARY: 156 mg/dL — AB (ref 65–99)
GLUCOSE-CAPILLARY: 192 mg/dL — AB (ref 65–99)
Glucose-Capillary: 126 mg/dL — ABNORMAL HIGH (ref 65–99)
Glucose-Capillary: 148 mg/dL — ABNORMAL HIGH (ref 65–99)
Glucose-Capillary: 187 mg/dL — ABNORMAL HIGH (ref 65–99)

## 2016-09-23 LAB — HEMOGLOBIN A1C
HEMOGLOBIN A1C: 11.5 % — AB (ref 4.8–5.6)
MEAN PLASMA GLUCOSE: 283 mg/dL

## 2016-09-23 LAB — LIPASE, BLOOD: LIPASE: 28 U/L (ref 11–51)

## 2016-09-23 LAB — MAGNESIUM: Magnesium: 1.9 mg/dL (ref 1.7–2.4)

## 2016-09-23 MED ORDER — POTASSIUM CHLORIDE CRYS ER 20 MEQ PO TBCR
40.0000 meq | EXTENDED_RELEASE_TABLET | ORAL | Status: AC
  Administered 2016-09-23 (×2): 40 meq via ORAL
  Filled 2016-09-23 (×2): qty 2

## 2016-09-23 MED ORDER — CHLORPROMAZINE HCL 25 MG PO TABS
25.0000 mg | ORAL_TABLET | Freq: Three times a day (TID) | ORAL | Status: DC | PRN
  Administered 2016-09-23: 25 mg via ORAL
  Filled 2016-09-23 (×2): qty 1

## 2016-09-23 NOTE — Progress Notes (Signed)
Inpatient Diabetes Program Recommendations  AACE/ADA: New Consensus Statement on Inpatient Glycemic Control (2015)  Target Ranges:  Prepandial:   less than 140 mg/dL      Peak postprandial:   less than 180 mg/dL (1-2 hours)      Critically ill patients:  140 - 180 mg/dL   Spoke with patient today and inquired about the situation around her not taking her insulin prior to admission. Patient reports she was at work and did not have her supplies in order to take her insulin. It was not a knowledge gap with sick day guidelines. Informed patient about her A1c level 11.5% this admission. [facility]. Spoke with patient about glucose control to prevent short term and long term complications.  Thanks,  Christena DeemShannon Granville Whitefield RN, MSN, [facility] Inpatient Diabetes Coordinator Team Pager [phone] (8a-5p)

## 2016-09-23 NOTE — Progress Notes (Signed)
PROGRESS NOTE    Tricia Boone  LKG:[id number] DOB: April 18, 1991 DOA: 09/18/2016 PCP: No PCP Per Patient  Brief Narrative:  Tricia Boone is a 26 y.o. female with medical history significant for type 1 diabetes mellitus, presenting in [facility] where she was seen for malaise, lethargy, generalized abdominal discomfort, and vomiting. Patient reports that she was in her usual state of health until last night when she noted the insidious development of malaise, lethargy, and generalized abdominal discomfort with recurrent nonbloody nonbilious vomiting. Of note, she reports running out of syringes and not using any insulin yesterday for this reason. She denies recent fevers or chills, denies cough or dyspnea, and denies any chest pain or palpitations. Patient also denies any significant use of alcohol or recent use of illicit substances. She denies recent fall or trauma and denies headache, change in vision or hearing, or focal numbness or weakness. She describes her abdominal pain as, moderate in intensity, generalized, with no alleviating or exacerbating factors identified, associated with nausea and vomiting, but no diarrhea. She also denies melena or hematochezia. There's been no recent long distance travel or sick contacts. Admitted and found to have a possible Esophagitis, Colitis, and less likely Pyelonephritis. Blood Sugars were uncontrolled but after intervention GAP closed. Patients Vomiting improved significantly but still complaining of being nauseous today.    Assessment & Plan:   Principal Problem:   DKA (diabetic ketoacidoses) (HCC) Active Problems:   Metabolic acidosis   Acute encephalopathy   Type 1 diabetes mellitus (HCC)   SIRS (systemic inflammatory response syndrome) (HCC)   Renal insufficiency   Kidney disease   Intractable vomiting  Sepsis 2/2 to suspected Esophagitis, Colitis, less likely Pyelonephritis -S/p 4 Liters of NS and now on  Maintenance Fluid with D5W 1/2 NS at 75 mL/hr -CT Scan of Abdomen/Pelvis showed Heterogenous perfusion of the Right Kidney concerning for Pyelonephritis, however given Urinalysis findings it is less Likely; Scan also showed extensive scarring in the Left Kidney likely 2/2 to prior Episodes of pyelonephritis. -CT scan also showed Scattered areas of mural thickening in the colon, most evident in the proximal descending colon where there is a small amount of adjacent free fluid, concerning for colitis. -Marked Thickening of the Distal Esophagus was also incompletely evaluated but suggestive of esophagitis -WBC went from 21.5 -> 23.5 -> 15.7 -> 11.2 and patient has low grade temperature of 99.6 -C/w Broad Spectrum Abx with IV Vancomycin and Zosyn -Infuenza A/B Negative; CXR shows normal examination with the heart size and mediastinal contours being wnl -Blood Cx x2 showed NGTD at 2 days and Urine Cx Showed Multiple Species present -Patient on Several Antiemetics; Abdominal Pain Improved  -Lactic Acid Level was elevated at 2.0 and went to 4.8; Improving to 2.8 now -Lipase Pending but CT Abd/Pelvis didn't show any Pancreatic Abnormalities  -improving, d/c ivf, advance diet  Intractable Nausea/Vomiting likely 2/2 to Above, improved  -Patient Positive for Opiates -C/w Metoclopramide 10 mg IV q8h, Zofran po/IV 4 mg q6hprn, Compazine 10 mg IV q6hprn, and Promethazine 12.5 mg IV q6hprn -improving  Oral thrush, topical nystatin solution , oral diflucan Oral thrush has much improved  Hepatic Steatosis and elevated AST, Noted on CT Scan -Acute Hepatitis Panel negative, and HIV pending -abdomina us pending   Suspected DKA, type 1 DM, improved - [facility] with malaise, lethargy, vomiting after going a day without insulin  - She was found to have metabolic acidosis with elevated AG, +  urine ketones, serum ketone  not  checked  - gap closed,   - Last one in system was 14.3 in 2011, a1c this  hospitalization 11.5. -diabetes education, continue adjust insulin  Acute encephalopathy, improved - Was noted to be lethargic and confused at [facility]  - Confusion seems to have cleared by arrival at [facility], but she remains somnolent, possibly from DKA  - UDS only positive for Opiods, sepsis - Likely a metabolic encephalopathy in setting of marked electrolyte derangements  and dka and possible sepsis  AKI, improving - SCr is 1.44 on admission,  - - Improved BUN/Cr to 7/0.84 after hydration -  Avoid nephrotoxins, repeat chem panel    Hyperlipidemia -Obtain Lipid Panel  Hypokalemia -Patient's K+ was 2,8 -replace -Repeat CMP in AM  Hypophosphetemia  -Patient's Phos level was 2.4 -Replete with Potassium Phosphate 15 mmol x 1 dose -Repeat Phos in AM 2.7   DVT prophylaxis: Enoxaparin 40 mg sq q24h Code Status: FULL CODE Family Communication: No Family present at bedside Disposition Plan: home , hopfully on 3/23  Consultants:   None  Procedures:   CT Abd/Pelvis  Antimicrobials:  Anti-infectives    Start     Dose/Rate Route Frequency Ordered Stop   09/22/16 2200  amoxicillin-clavulanate (AUGMENTIN) 875-125 MG per tablet 1 tablet     1 tablet Oral Every 12 hours 09/22/16 1042     09/22/16 1200  fluconazole (DIFLUCAN) tablet 100 mg     100 mg Oral Daily 09/22/16 1042     09/22/16 0800  vancomycin (VANCOCIN) IVPB 750 mg/150 ml premix  Status:  Discontinued     750 mg 150 mL/hr over 60 Minutes Intravenous Every 12 hours 09/21/16 2337 09/22/16 1035   09/19/16 1000  vancomycin (VANCOCIN) 500 mg in sodium chloride 0.9 % 100 mL IVPB  Status:  Discontinued     500 mg 100 mL/hr over 60 Minutes Intravenous Every 12 hours 09/18/16 1952 09/21/16 2337   09/19/16 0400  piperacillin-tazobactam (ZOSYN) IVPB 3.375 g  Status:  Discontinued     3.375 g 12.5 mL/hr over 240 Minutes Intravenous Every 8 hours 09/18/16 1952 09/22/16 1042   09/18/16 1945  piperacillin-tazobactam (ZOSYN) IVPB 3.375  g     3.375 g 100 mL/hr over 30 Minutes Intravenous  Once 09/18/16 1933 09/18/16 2116   09/18/16 1945  vancomycin (VANCOCIN) IVPB 1000 mg/200 mL premix     1,000 mg 200 mL/hr over 60 Minutes Intravenous  Once 09/18/16 1933 09/18/16 2200     Subjective: Doing better, tolerating full liquid, still some nausea, no vomiting for two days, denies pain  Objective: Vitals:   09/22/16 0422 09/22/16 2045 09/23/16 0443 09/23/16 1412  BP: (!) 160/103 (!) 142/96 (!) 163/106 (!) 148/96  Pulse: 98 (!) 101 (!) 103 95  Resp: 20 20 20 20   Temp: 99.7 F (37.6 C) 99.4 F (37.4 C) 98.6 F (37 C) 98.6 F (37 C)  TempSrc: Oral Oral Oral Oral  SpO2: 93% 96% 100% 98%  Weight: 62.8 kg (138 lb 6.4 oz)  62.8 kg (138 lb 8 oz)   Height:        Intake/Output Summary (Last 24 hours) at 09/23/16 1903 Last data filed at 09/23/16 1830  Gross per 24 hour  Intake           971.25 ml  Output                0 ml  Net  971.25 ml   Filed Weights   09/19/16 0407 09/22/16 0422 09/23/16 0443  Weight: 58.5 kg (128 lb 15.5 oz) 62.8 kg (138 lb 6.4 oz) 62.8 kg (138 lb 8 oz)   Examination: Physical Exam:  Constitutional:  26 year old who looks older than stated age and appears better than yesterday  Eyes: Lids and conjunctivae normal, sclerae anicteric  ENMT: External Ears, Nose appear normal. Grossly normal hearing. Mouth with no lesions noted.   Neck: Appears normal, supple, no cervical masses, normal ROM, no appreciable thyromegaly, no JVD Respiratory: Diminished to auscultation bilaterally, no wheezing, rales, rhonchi or crackles. Normal respiratory effort and patient is not tachypenic. No accessory muscle use.  Cardiovascular: Tachycardic Rate and Rhythm, no murmurs / rubs / gallops. S1 and S2 auscultated. No extremity edema.   Abdomen: Soft, tender to palpate, non-distended. No masses palpated. No appreciable hepatosplenomegaly. Bowel sounds positive x4.  GU: Deferred. Musculoskeletal: No clubbing /  cyanosis of digits/nails. No joint deformity upper and lower extremities.  Skin: No rashes, lesions, ulcers. No induration; Warm and dry.  Neurologic: CN 2-12 grossly intact with no focal deficits. Romberg sign cerebellar reflexes not assessed.  Psychiatric: Anxious mood and appropriate affect.   Data Reviewed: I have personally reviewed following labs and imaging studies  CBC:  Recent Labs Lab 09/19/16 0214 09/20/16 0511 09/21/16 0624 09/22/16 0612 09/23/16 0551  WBC 23.5* 15.7* 11.2* 9.1 11.6*  NEUTROABS 19.0* 11.7* 7.3 5.5  --   HGB 9.6* 9.1* 9.4* 8.6* 10.3*  HCT 29.1* 28.5* 29.1* 27.2* 31.6*  MCV 78.2 80.3 80.2 79.3 79.4  PLT 390 364 332 296 376   Basic Metabolic Panel:  Recent Labs Lab 09/19/16 1912 09/20/16 0511 09/21/16 0624 09/22/16 0612 09/23/16 0551  NA 145 150* 146* 140 141  K 3.3* 3.0* 2.8* 3.2* 3.1*  CL 116* 120* 116* 111 109  CO2 19* 22 22 21* 22  GLUCOSE 315* 83 112* 138* 135*  BUN 11 10 7  <5* 6  CREATININE 1.17* 0.93 0.84 0.79 0.94  CALCIUM 7.2* 7.5* 7.3* 7.1* 7.7*  MG  --  2.0 1.8 2.0 1.9  PHOS  --  2.7 2.4* 2.7  --    GFR: Estimated Creatinine Clearance: 78.3 mL/min (by C-G formula based on SCr of 0.94 mg/dL). Liver Function Tests:  Recent Labs Lab 09/19/16 0010 09/20/16 0511 09/21/16 0624 09/22/16 0612 09/23/16 0551  AST 36 43* 81* 62* 89*  ALT 55* 45 56* 48 58*  ALKPHOS 81 70 74 68 80  BILITOT 0.5 0.5 0.8 0.6 0.7  PROT 6.3* 6.1* 6.1* 5.5* 6.8  ALBUMIN 2.9* 2.7* 2.7* 2.4* 2.8*   No results for input(s): LIPASE, AMYLASE in the last 168 hours. No results for input(s): AMMONIA in the last 168 hours. Coagulation Profile:  Recent Labs Lab 09/19/16 0010  INR 1.05   Cardiac Enzymes: No results for input(s): CKTOTAL, CKMB, CKMBINDEX, TROPONINI in the last 168 hours. BNP (last 3 results) No results for input(s): PROBNP in the last 8760 hours. HbA1C:  Recent Labs  09/22/16 0612  HGBA1C 11.5*   CBG:  Recent Labs Lab  09/23/16 0439 09/23/16 0734 09/23/16 1057 09/23/16 1151 09/23/16 1649  GLUCAP 126* 111* 148* 156* 192*   Lipid Profile: No results for input(s): CHOL, HDL, LDLCALC, TRIG, CHOLHDL, LDLDIRECT in the last 72 hours. Thyroid Function Tests: No results for input(s): TSH, T4TOTAL, FREET4, T3FREE, THYROIDAB in the last 72 hours. Anemia Panel: No results for input(s): VITAMINB12, FOLATE, FERRITIN, TIBC, IRON, RETICCTPCT in  the last 72 hours. Sepsis Labs:  Recent Labs Lab 09/19/16 0010 09/19/16 0214 09/19/16 1912 09/20/16 0020 09/20/16 0511  PROCALCITON 2.16  --   --   --   --   LATICACIDVEN  --  2.0* 4.6* 4.8* 2.8*    Recent Results (from the past 240 hour(s))  Culture, blood (x 2)     Status: None (Preliminary result)   Collection Time: 09/18/16  8:02 PM  Result Value Ref Range Status   Specimen Description BLOOD RIGHT ANTECUBITAL  Final   Special Requests IN PEDIATRIC BOTTLE 1CC  Final   Culture   Final    NO GROWTH 4 DAYS Performed at [facility], [address], [place], [place] [address]    Report Status PENDING  Incomplete  Culture, blood (x 2)     Status: None (Preliminary result)   Collection Time: 09/18/16  8:02 PM  Result Value Ref Range Status   Specimen Description BLOOD BLOOD RIGHT FOREARM  Final   Special Requests IN PEDIATRIC BOTTLE 1CC  Final   Culture   Final    NO GROWTH 4 DAYS Performed at [facility], [address], [place], [place] [address]    Report Status PENDING  Incomplete  Urine culture     Status: Abnormal   Collection Time: 09/19/16  7:18 AM  Result Value Ref Range Status   Specimen Description URINE, CLEAN CATCH  Final   Special Requests NONE  Final   Culture MULTIPLE SPECIES PRESENT, SUGGEST RECOLLECTION (A)  Final   Report Status 09/20/2016 FINAL  Final    Radiology Studies: No results found. Scheduled Meds: . amoxicillin-clavulanate  1 tablet Oral Q12H  . enoxaparin (LOVENOX) injection  40 mg Subcutaneous Q24H  .  fluconazole  100 mg Oral Daily  . insulin aspart  0-9 Units Subcutaneous Q4H  . insulin detemir  20 Units Subcutaneous QHS  . metoCLOPramide (REGLAN) injection  10 mg Intravenous Q8H  . senna-docusate  1 tablet Oral BID  . sodium chloride flush  3 mL Intravenous Q12H   Continuous Infusions:   LOS: 5 days   Capers Hagmann, MD PhD [facility] Pager [phone]  If 7PM-7AM, please contact night-coverage [email or web address] Password [facility] 09/23/2016, 7:03 PM

## 2016-09-24 ENCOUNTER — Inpatient Hospital Stay (HOSPITAL_COMMUNITY): Payer: Self-pay

## 2016-09-24 ENCOUNTER — Other Ambulatory Visit (HOSPITAL_COMMUNITY): Payer: Self-pay

## 2016-09-24 DIAGNOSIS — K529 Noninfective gastroenteritis and colitis, unspecified: Secondary | ICD-10-CM

## 2016-09-24 DIAGNOSIS — A419 Sepsis, unspecified organism: Principal | ICD-10-CM

## 2016-09-24 DIAGNOSIS — K3184 Gastroparesis: Secondary | ICD-10-CM

## 2016-09-24 DIAGNOSIS — E1143 Type 2 diabetes mellitus with diabetic autonomic (poly)neuropathy: Secondary | ICD-10-CM

## 2016-09-24 DIAGNOSIS — B370 Candidal stomatitis: Secondary | ICD-10-CM

## 2016-09-24 LAB — CULTURE, BLOOD (ROUTINE X 2)
Culture: NO GROWTH
Culture: NO GROWTH

## 2016-09-24 LAB — COMPREHENSIVE METABOLIC PANEL
ALBUMIN: 2.3 g/dL — AB (ref 3.5–5.0)
ALT: 44 U/L (ref 14–54)
ANION GAP: 6 (ref 5–15)
AST: 56 U/L — AB (ref 15–41)
Alkaline Phosphatase: 74 U/L (ref 38–126)
BILIRUBIN TOTAL: 0.5 mg/dL (ref 0.3–1.2)
BUN: 6 mg/dL (ref 6–20)
CHLORIDE: 110 mmol/L (ref 101–111)
CO2: 25 mmol/L (ref 22–32)
Calcium: 7.6 mg/dL — ABNORMAL LOW (ref 8.9–10.3)
Creatinine, Ser: 0.9 mg/dL (ref 0.44–1.00)
GFR calc Af Amer: >60 mL/min (ref >60)
GFR calc non Af Amer: >60 mL/min (ref >60)
GLUCOSE: 76 mg/dL (ref 65–99)
POTASSIUM: 3.2 mmol/L — AB (ref 3.5–5.1)
SODIUM: 141 mmol/L (ref 135–145)
TOTAL PROTEIN: 5.6 g/dL — AB (ref 6.5–8.1)

## 2016-09-24 LAB — GLUCOSE, CAPILLARY
Glucose-Capillary: 72 mg/dL (ref 65–99)
Glucose-Capillary: 70 mg/dL (ref 65–99)
Glucose-Capillary: 94 mg/dL (ref 65–99)
Glucose-Capillary: 153 mg/dL — ABNORMAL HIGH (ref 65–99)

## 2016-09-24 LAB — CBC
HCT: 26.9 % — ABNORMAL LOW (ref 36.0–46.0)
Hemoglobin: 8.8 g/dL — ABNORMAL LOW (ref 12.0–15.0)
MCH: 26.1 pg (ref 26.0–34.0)
MCHC: 32.7 g/dL (ref 30.0–36.0)
MCV: 79.8 fL (ref 78.0–100.0)
PLATELETS: 365 10*3/uL (ref 150–400)
RBC: 3.37 MIL/uL — AB (ref 3.87–5.11)
RDW: 14.4 % (ref 11.5–15.5)
WBC: 13.2 10*3/uL — AB (ref 4.0–10.5)

## 2016-09-24 LAB — LIPID PANEL
CHOL/HDL RATIO: 3.7 ratio
Cholesterol: 111 mg/dL (ref 0–200)
HDL: 30 mg/dL — AB (ref >40)
LDL CALC: 52 mg/dL (ref 0–99)
TRIGLYCERIDES: 146 mg/dL (ref <150)
VLDL: 29 mg/dL (ref 0–40)

## 2016-09-24 LAB — TSH: TSH: 2.992 u[IU]/mL (ref 0.350–4.500)

## 2016-09-24 MED ORDER — DEXTROSE-NACL 5-0.45 % IV SOLN
INTRAVENOUS | Status: DC
  Administered 2016-09-24: 05:00:00 via INTRAVENOUS

## 2016-09-24 MED ORDER — LISINOPRIL 5 MG PO TABS: 5.0000 mg | ORAL_TABLET | Freq: Every day | ORAL | 0 refills | Status: DC

## 2016-09-24 MED ORDER — METOCLOPRAMIDE HCL 5 MG PO TABS: 5.0000 mg | ORAL_TABLET | Freq: Four times a day (QID) | ORAL | 0 refills | Status: AC | PRN

## 2016-09-24 MED ORDER — FAMOTIDINE 20 MG PO TABS: 20.0000 mg | ORAL_TABLET | Freq: Every day | ORAL | 0 refills | Status: DC

## 2016-09-24 MED ORDER — CLONIDINE HCL 0.1 MG PO TABS
0.1000 mg | ORAL_TABLET | Freq: Once | ORAL | Status: AC
  Administered 2016-09-24: 0.1 mg via ORAL
  Filled 2016-09-24: qty 1

## 2016-09-24 MED ORDER — LISINOPRIL 10 MG PO TABS
5.0000 mg | ORAL_TABLET | Freq: Once | ORAL | Status: AC
  Administered 2016-09-24: 5 mg via ORAL
  Filled 2016-09-24: qty 1

## 2016-09-24 MED ORDER — POTASSIUM CHLORIDE CRYS ER 20 MEQ PO TBCR: 40.0000 meq | EXTENDED_RELEASE_TABLET | Freq: Every day | ORAL | 0 refills | Status: DC

## 2016-09-24 MED ORDER — FLUCONAZOLE 100 MG PO TABS: 100.0000 mg | ORAL_TABLET | Freq: Every day | ORAL | 0 refills | Status: DC

## 2016-09-24 MED ORDER — POTASSIUM CHLORIDE CRYS ER 20 MEQ PO TBCR
40.0000 meq | EXTENDED_RELEASE_TABLET | ORAL | Status: AC
  Administered 2016-09-24 (×2): 40 meq via ORAL
  Filled 2016-09-24 (×2): qty 2

## 2016-09-24 MED ORDER — FLUCONAZOLE 100 MG PO TABS: 100.0000 mg | ORAL_TABLET | Freq: Every day | ORAL | 0 refills | Status: AC

## 2016-09-24 MED ORDER — AMOXICILLIN-POT CLAVULANATE 875-125 MG PO TABS: 1.0000 | ORAL_TABLET | Freq: Two times a day (BID) | ORAL | 0 refills | Status: AC

## 2016-09-24 NOTE — Progress Notes (Signed)
Pt's BP elevated at 167/109 and pt is also c/o headache 10/10, gave norco 2 tab. Dr. Roda ShuttersXu was notified about BP. New order for lisinopril 5mg  obtained and med was administered to pt. Repeat BP check post lisinopril was 175/104. Dr. Roda ShuttersXU was notified again and new order for clonidine 0.1mg  obtained and administered. Pt still c/o headache 9/10, morphine 2mg  given. BP recheck 154/102. Headache now 3/10. Pt stated she was filling better and wish to be discharged

## 2016-09-24 NOTE — Progress Notes (Signed)
Pt plan to discharge home with no HH needs. [place]

## 2016-09-24 NOTE — Discharge Summary (Signed)
Discharge Summary  Tricia Boone ZOX:[id number] DOB: 1991-06-02  PCP: No PCP Per Patient  Admit date: 09/18/2016 Discharge date: 09/24/2016  Time spent: >75mins, more than 50% time spent on coordination of care and patient's counseling.  Recommendations for Outpatient Follow-up:  1. F/u with PMD within a week  for hospital discharge follow up, repeat cbc/bmp at follow up, HIV screening test pending at discharge, pmd to follow up on result. pmd to refer patient to GI if n/v reoccurs.  2. f/u with endocrinology for uncontrolled diabetes  Discharge Diagnoses:  Active Hospital Problems   Diagnosis Date Noted  . DKA (diabetic ketoacidoses) (HCC) 09/18/2016  . Metabolic acidosis 09/18/2016  . Acute encephalopathy 09/18/2016  . Type 1 diabetes mellitus (HCC) 09/18/2016  . SIRS (systemic inflammatory response syndrome) (HCC) 09/18/2016  . Renal insufficiency 09/18/2016  . Kidney disease 09/18/2016  . Intractable vomiting     Resolved Hospital Problems   Diagnosis Date Noted Date Resolved  No resolved problems to display.    Discharge Condition: stable  Diet recommendation: heart healthy/carb modified  Filed Weights   09/22/16 0422 09/23/16 0443 09/24/16 0448  Weight: 62.8 kg (138 lb 6.4 oz) 62.8 kg (138 lb 8 oz) 63.2 kg (139 lb 6.4 oz)    History of present illness:  Patient coming from: Home, by way of [facility]          Chief Complaint: Malaise, vomiting, gen abd pain, uncontrolled glucose  HPI: Tricia Boone is a 26 y.o. female with medical history significant for type 1 diabetes mellitus, presenting in transfer from [facility] where she was seen for malaise, lethargy, generalized abdominal discomfort, and vomiting. Patient reports that she was in her usual state of health until last night when she noted the insidious development of malaise, lethargy, and generalized abdominal discomfort with recurrent nonbloody nonbilious vomiting. Of note, she reports  running out of syringes and not using any insulin yesterday for this reason. She denies recent fevers or chills, denies cough or dyspnea, and denies any chest pain or palpitations. Patient also denies any significant use of alcohol or recent use of illicit substances. She denies recent fall or trauma and denies headache, change in vision or hearing, or focal numbness or weakness. She describes her abdominal pain as, moderate in intensity, generalized, with no alleviating or exacerbating factors identified, associated with nausea and vomiting, but no diarrhea. She also denies melena or hematochezia. There's been no recent long distance travel or sick contacts.   [facility] ED Course: Upon arrival to the [facility] ED, patient is found to be afebrile, mildly tachypneic, tachycardic in the 130s, and with stable blood pressure. Chest x-ray is negative for acute cardiopulmonary disease and chemistry panels notable for a potassium of 5.3, bicarbonate of 12, creatinine 1.44, glucose 258, and anion gap ">20." CBC was notable for a leukocytosis to 21,500 and a thrombocytosis 462,000. Patient was given a total of 4 L of normal saline, multiple doses of IV Zofran, and 5 units of IV Novolin. Glucose came down to the low 100s with insulin and has remained in that range. Insulin infusion was not started and the IV fluids were changed to D5 in normal saline. Patient was noted to be somewhat confused and very lethargic in the emergency department and UDS was ordered and remains pending. Transfer to [facility] was requested for admission in order to further evaluate and manage patient's metabolic acidosis and acute encephalopathy, likely due to DKA in  the setting of missed insulin yesterday, but with concern remaining for an alternative etiology such as infection.   Hospital Course:  Principal Problem:   DKA (diabetic ketoacidoses) (HCC) Active Problems:   Metabolic acidosis   Acute encephalopathy    Type 1 diabetes mellitus (HCC)   SIRS (systemic inflammatory response syndrome) (HCC)   Renal insufficiency   Kidney disease   Intractable vomiting  Sepsis 2/2 to suspected Esophagitis, Colitis, less likely Pyelonephritis -patient presented to [facility] ED with complaints of vomiting and lethargy, leukocytosis wbc of 23.5, sinus tachycardia, lactic acid 4.8 -CT Scan of Abdomen/Pelvis showed Heterogenous perfusion of the Right Kidney concerning for Pyelonephritis, however ua no sign of infection; Scan also showed extensive scarring in the Left Kidney likely 2/2 to prior Episodes of pyelonephritis. -CT scan also showed Scattered areas of mural thickening in the colon, most evident in the proximal descending colon where there is a small amount of adjacent free fluid, concerning for colitis.Marked Thickening of the Distal Esophagus was also incompletely evaluated but suggestive of esophagitis -Infuenza A/B Negative; CXR shows normal examination with the heart size and mediastinal contours being wnl -Blood Cx x2 showed NGTD  and Urine Cx Showed Multiple Species present -she is started on vanc/zosyn since admission, her symptom has improved ,  Wbc and Lactic acid trending down, abdominal pain and n/v has resolved, abx changed to augmentin  Intractable Nausea/Vomiting likely 2/2 to colitis and diabetic gastroparesis --she received  Metoclopramide 10 mg IV q8h, Zofran po/IV 4 mg q6hprn, Compazine 10 mg IV q6hprn, and Promethazine 12.5 mg IV q6hprn -symptom has resolved, she tolerated regular diet at discharge, she is discharge home with prn reglan and prn zofran  Oral thrush, topical nystatin solution , oral diflucan Oral thrush has much improved, she is discharged on 5 more days of diflucan to finish treatment course  elevated AST, Hepatic Steatosis Noted on CT Scan -Acute Hepatitis Panel negative, and HIV antibody in process -abdomina US "No gallstones or wall thickening visualized.  There is no pericholecystic fluid. No sonographic Murphy sign noted by sonographer.  Common bile duct: Diameter: 3 mm. No intrahepatic or extrahepatic biliary duct dilatation. Liver: No focal lesion identified. Within normal limits in parenchymal echogenicity. -she denies alcohol use -pmd to continue monitor lft  Suspected DKA, type 1 DM, resolved - Pt presented to [facility] with malaise, lethargy, vomiting after going a day without insulin  - She was found to have metabolic acidosis with elevated AG, + urine ketones, serum ketone  not  checked  - she restarted back on insulin, and she is treated with ivf, gap closed. Symptom resolved.   - Last a1c  in system was 14.3 in 2011, a1c this hospitalization 11.5. -Patient report she does not check her blood sugar regularly, I also suspect diet and medication noncompliance.  -patient need ongoing diabetes education, continue adjust insulin,  -she is advised to follow up with endocrinology closely, she report she does not need any prescription for diabetes meds /supplies, she has no problem getting these.  Acute encephalopathy, resolved - Was noted to be lethargic and confused at [facility]  -  Likely a metabolic encephalopathy in setting of marked electrolyte derangements and dka and possible sepsis -resolved  AKI, resolved - SCr is 1.44 on admission,  - -cr 0.7-0.9 at discharge  dyslipidemia -Lipid Panel hdl 30, ldl 52  Hypokalemia -Patient's K+ was 2,8, mag unremarkable -replaced -she is discharge on 5 more days of potassium supplement, she is  to follow up with pmd to repeat labs  Hypophosphetemia  -Patient's Phos level was 2.4 -Replete with Potassium Phosphate 15 mmol x 1 dose -Repeat Phos in AM 2.7  HTN: she used to be on lisinopril, she has not been taking it for a while, low dose lisinopril prescribed at discharge.  DVT prophylaxis while she is in the hospital: Enoxaparin 40 mg sq q24h Code Status: FULL CODE Family  Communication: No Family present at bedside Disposition Plan: home  on 3/23  Consultants:   None  Procedures:   CT Abd/Pelvis  Antimicrobials:            Anti-infectives    Start     Dose/Rate Route Frequency Ordered Stop   09/22/16 2200  amoxicillin-clavulanate (AUGMENTIN) 875-125 MG per tablet 1 tablet     1 tablet Oral Every 12 hours 09/22/16 1042     09/22/16 1200  fluconazole (DIFLUCAN) tablet 100 mg     100 mg Oral Daily 09/22/16 1042     09/22/16 0800  vancomycin (VANCOCIN) IVPB 750 mg/150 ml premix  Status:  Discontinued     750 mg 150 mL/hr over 60 Minutes Intravenous Every 12 hours 09/21/16 2337 09/22/16 1035   09/19/16 1000  vancomycin (VANCOCIN) 500 mg in sodium chloride 0.9 % 100 mL IVPB  Status:  Discontinued     500 mg 100 mL/hr over 60 Minutes Intravenous Every 12 hours 09/18/16 1952 09/21/16 2337   09/19/16 0400  piperacillin-tazobactam (ZOSYN) IVPB 3.375 g  Status:  Discontinued     3.375 g 12.5 mL/hr over 240 Minutes Intravenous Every 8 hours 09/18/16 1952 09/22/16 1042   09/18/16 1945  piperacillin-tazobactam (ZOSYN) IVPB 3.375 g     3.375 g 100 mL/hr over 30 Minutes Intravenous  Once 09/18/16 1933 09/18/16 2116   09/18/16 1945  vancomycin (VANCOCIN) IVPB 1000 mg/200 mL premix     1,000 mg 200 mL/hr over 60 Minutes Intravenous  Once 09/18/16 1933 09/18/16 2200      Discharge Exam: BP (!) 129/91 (BP Location: Right Arm)   Pulse (!) 107   Temp 99.4 F (37.4 C) (Oral)   Resp 20   Ht 5\' 2"  (1.575 m)   Wt 63.2 kg (139 lb 6.4 oz)   LMP 09/18/2016   SpO2 92%   BMI 25.50 kg/m   General: NAD, flat affect, guarded  Cardiovascular: RRR Respiratory: CTABL Abdomen: soft, nontender, +bs Extremity: no edema  Discharge Instructions You were cared for by a hospitalist during your hospital stay. If you have any questions about your discharge medications or the care you received while you were in the hospital after you are discharged, you  can call the unit and asked to speak with the hospitalist on call if the hospitalist that took care of you is not available. Once you are discharged, your primary care physician will handle any further medical issues. Please note that NO REFILLS for any discharge medications will be authorized once you are discharged, as it is imperative that you return to your primary care physician (or establish a relationship with a primary care physician if you do not have one) for your aftercare needs so that they can reassess your need for medications and monitor your lab values.  Discharge Instructions    Diet Carb Modified    Complete by:  As directed    Increase activity slowly    Complete by:  As directed      Allergies as of 09/24/2016   No Known  Allergies     Medication List    TAKE these medications   amoxicillin-clavulanate 875-125 MG tablet Commonly known as:  AUGMENTIN Take 1 tablet by mouth every 12 (twelve) hours.   famotidine 20 MG tablet Commonly known as:  PEPCID Take 1 tablet (20 mg total) by mouth at bedtime.   fluconazole 100 MG tablet Commonly known as:  DIFLUCAN Take 1 tablet (100 mg total) by mouth daily. Start taking on:  09/25/2016   insulin aspart 100 UNIT/ML injection Commonly known as:  novoLOG Inject 25 Units into the skin 3 (three) times daily before meals.   insulin detemir 100 UNIT/ML injection Commonly known as:  LEVEMIR Inject 25 Units into the skin at bedtime.   lisinopril 5 MG tablet Commonly known as:  PRINIVIL,ZESTRIL Take 1 tablet (5 mg total) by mouth daily.   metoCLOPramide 5 MG tablet Commonly known as:  REGLAN Take 1 tablet (5 mg total) by mouth every 6 (six) hours as needed for nausea.   ondansetron 8 MG disintegrating tablet Commonly known as:  ZOFRAN ODT Take 1 tablet (8 mg total) by mouth every 8 (eight) hours as needed for nausea or vomiting.   potassium chloride SA 20 MEQ tablet Commonly known as:  K-DUR,KLOR-CON Take 2 tablets (40  mEq total) by mouth daily.   TYLENOL 325 MG tablet Generic drug:  acetaminophen Take 650 mg by mouth every 6 (six) hours as needed.      No Known Allergies Follow-up Information    [facility] up on 10/18/2016.   Why:  for hospital discharge follow up, repeat cbc/bmp at follow up.  pmd to refer patient to gastroenterology if nausea and vomiting reoccur.        Nickolas MadridMekala, Kavya, MD Follow up in 2 week(s).   Specialty:  Internal Medicine Why:  for diabetes control Contact information: [address] [place] [id number] [phone]            The results of significant diagnostics from this hospitalization (including imaging, microbiology, ancillary and laboratory) are listed below for reference.    Significant Diagnostic Studies: Dg Chest 2 View  Result Date: 09/18/2016 CLINICAL DATA:  Vomiting. EXAM: CHEST  2 VIEW COMPARISON:  07/07/2017. FINDINGS: The heart size and mediastinal contours are within normal limits. Both lungs are clear. The visualized skeletal structures are unremarkable. IMPRESSION: Normal examination. Electronically Signed   By: Beckie SaltsSteven  Reid M.D.   On: 09/18/2016 13:24   Ct Abdomen Pelvis W Contrast  Result Date: 09/19/2016 CLINICAL DATA:  26 year old female with history of abdominal pain and uncontrolled nausea and vomiting. EXAM: CT ABDOMEN AND PELVIS WITH CONTRAST TECHNIQUE: Multidetector CT imaging of the abdomen and pelvis was performed using the standard protocol following bolus administration of intravenous contrast. CONTRAST:  100mL ISOVUE-300 IOPAMIDOL (ISOVUE-300) INJECTION 61% COMPARISON:  CT the abdomen and pelvis 11/11/2012. FINDINGS: Lower chest: Scattered areas of scarring in the left lung base. Marked thickening of the distal esophagus. Hepatobiliary: Diffuse low attenuation throughout the hepatic parenchyma, concerning for hepatic steatosis. No definite cystic or solid hepatic lesions. No  intra or extrahepatic biliary ductal dilatation. Gallbladder is normal in appearance. Pancreas: No pancreatic mass. No pancreatic ductal dilatation. No pancreatic or peripancreatic fluid or inflammatory changes. Spleen: Unremarkable. Adrenals/Urinary Tract: Multifocal cortical thinning in the left kidney, compatible with scarring from prior infection or infarctions. Heterogeneous areas of perfusion, most apparent in the right kidney, concerning for pyelonephritis. No hydroureteronephrosis. The urinary  bladder is normal in appearance. Bilateral adrenal glands are normal in appearance. Stomach/Bowel: The appearance of the stomach is normal. There is no pathologic dilatation of small bowel or colon. Bowel wall thickening is evident, particularly in the colon in the proximal descending colon where there appears to be some hyperenhancement of the mucosa, and a small amount of adjacent fluid in the left pericolic gutter, best appreciated on axial image 22 of series 2. The appendix is not confidently identified and may be surgically absent. Regardless, there are no inflammatory changes noted adjacent to the cecum to suggest the presence of an acute appendicitis at this time. Vascular/Lymphatic: No significant atherosclerotic disease, aneurysm or dissection identified in the abdominal or pelvic vasculature. No lymphadenopathy noted in the abdomen or pelvis. Reproductive: Uterus and ovaries are unremarkable in appearance. Other: Trace volume of free fluid, most evident in the left pericolic gutter and in the cul-de-sac. No larger volume of ascites. No pneumoperitoneum. Musculoskeletal: There are no aggressive appearing lytic or blastic lesions noted in the visualized portions of the skeleton. IMPRESSION: 1. Heterogeneous perfusion of the right kidney. The appearance of this is concerning for potential pyelonephritis, and correlation with urinalysis is recommended. There is extensive scarring in the left kidney, likely  secondary to prior episodes of pyelonephritis. 2. Scattered areas of mural thickening in the colon, most evident in the proximal descending colon where there is a small amount of adjacent free fluid, concerning for colitis. 3. There is also marked thickening of the distal esophagus, which is incompletely evaluated, but suggestive of a esophagitis. 4. Hepatic steatosis. Electronically Signed   By: Trudie Reed M.D.   On: 09/19/2016 17:26   US Abdomen Limited Ruq  Result Date: 09/24/2016 CLINICAL DATA:  Elevated liver enzymes EXAM: US ABDOMEN LIMITED - RIGHT UPPER QUADRANT COMPARISON:  CT abdomen and pelvis September 19, 2016 FINDINGS: Gallbladder: No gallstones or wall thickening visualized. There is no pericholecystic fluid. No sonographic Murphy sign noted by sonographer. Common bile duct: Diameter: 3 mm. No intrahepatic or extrahepatic biliary duct dilatation. Liver: No focal lesion identified. Within normal limits in parenchymal echogenicity. Incidental note is made of a right pleural effusion. IMPRESSION: Right pleural effusion.  Study otherwise unremarkable. Electronically Signed   By: Bretta Bang III M.D.   On: 09/24/2016 09:57    Microbiology: Recent Results (from the past 240 hour(s))  Culture, blood (x 2)     Status: None (Preliminary result)   Collection Time: 09/18/16  8:02 PM  Result Value Ref Range Status   Specimen Description BLOOD RIGHT ANTECUBITAL  Final   Special Requests IN PEDIATRIC BOTTLE 1CC  Final   Culture   Final    NO GROWTH 4 DAYS Performed at [facility], [address] [place], [place] [address]    Report Status PENDING  Incomplete  Culture, blood (x 2)     Status: None (Preliminary result)   Collection Time: 09/18/16  8:02 PM  Result Value Ref Range Status   Specimen Description BLOOD BLOOD RIGHT FOREARM  Final   Special Requests IN PEDIATRIC BOTTLE 1CC  Final   Culture   Final    NO GROWTH 4 DAYS Performed at [facility], [address], [place], [place] [address]    Report Status PENDING  Incomplete  Urine culture     Status: Abnormal   Collection Time: 09/19/16  7:18 AM  Result Value Ref Range Status   Specimen Description URINE, CLEAN CATCH  Final  Special Requests NONE  Final   Culture MULTIPLE SPECIES PRESENT, SUGGEST RECOLLECTION (A)  Final   Report Status 09/20/2016 FINAL  Final     Labs: Basic Metabolic Panel:  Recent Labs Lab 09/20/16 0511 09/21/16 0624 09/22/16 0612 09/23/16 0551 09/24/16 0523  NA 150* 146* 140 141 141  K 3.0* 2.8* 3.2* 3.1* 3.2*  CL 120* 116* 111 109 110  CO2 22 22 21* 22 25  GLUCOSE 83 112* 138* 135* 76  BUN 10 7 <5* 6 6  CREATININE 0.93 0.84 0.79 0.94 0.90  CALCIUM 7.5* 7.3* 7.1* 7.7* 7.6*  MG 2.0 1.8 2.0 1.9  --   PHOS 2.7 2.4* 2.7  --   --    Liver Function Tests:  Recent Labs Lab 09/20/16 0511 09/21/16 0624 09/22/16 0612 09/23/16 0551 09/24/16 0523  AST 43* 81* 62* 89* 56*  ALT 45 56* 48 58* 44  ALKPHOS 70 74 68 80 74  BILITOT 0.5 0.8 0.6 0.7 0.5  PROT 6.1* 6.1* 5.5* 6.8 5.6*  ALBUMIN 2.7* 2.7* 2.4* 2.8* 2.3*    Recent Labs Lab 09/23/16 2003  LIPASE 28   No results for input(s): AMMONIA in the last 168 hours. CBC:  Recent Labs Lab 09/19/16 0214 09/20/16 0511 09/21/16 0624 09/22/16 0612 09/23/16 0551 09/24/16 0523  WBC 23.5* 15.7* 11.2* 9.1 11.6* 13.2*  NEUTROABS 19.0* 11.7* 7.3 5.5  --   --   HGB 9.6* 9.1* 9.4* 8.6* 10.3* 8.8*  HCT 29.1* 28.5* 29.1* 27.2* 31.6* 26.9*  MCV 78.2 80.3 80.2 79.3 79.4 79.8  PLT 390 364 332 296 376 365   Cardiac Enzymes: No results for input(s): CKTOTAL, CKMB, CKMBINDEX, TROPONINI in the last 168 hours. BNP: BNP (last 3 results) No results for input(s): BNP in the last 8760 hours.  ProBNP (last 3 results) No results for input(s): PROBNP in the last 8760 hours.  CBG:  Recent Labs Lab 09/23/16 2033 09/24/16 0001 09/24/16 0443 09/24/16 0800 09/24/16 1238  GLUCAP 187* 148* 72 70 94        Signed:  Camran Keady MD, PhD  [facility] 09/24/2016, 1:20 PM

## 2016-09-25 LAB — HIV ANTIBODY (ROUTINE TESTING W REFLEX): HIV Screen 4th Generation wRfx: NONREACTIVE

## 2016-11-23 ENCOUNTER — Encounter (HOSPITAL_BASED_OUTPATIENT_CLINIC_OR_DEPARTMENT_OTHER): Payer: Self-pay | Admitting: *Deleted

## 2016-11-23 ENCOUNTER — Emergency Department (HOSPITAL_BASED_OUTPATIENT_CLINIC_OR_DEPARTMENT_OTHER)
Admission: EM | Admit: 2016-11-23 | Discharge: 2016-11-23 | Disposition: A | Discharge status: Home / Self Care | Payer: Self-pay | Attending: Emergency Medicine | Admitting: Emergency Medicine

## 2016-11-23 DIAGNOSIS — H6121 Impacted cerumen, right ear: Secondary | ICD-10-CM | POA: Insufficient documentation

## 2016-11-23 DIAGNOSIS — Z79899 Other long term (current) drug therapy: Secondary | ICD-10-CM | POA: Insufficient documentation

## 2016-11-23 DIAGNOSIS — H66001 Acute suppurative otitis media without spontaneous rupture of ear drum, right ear: Secondary | ICD-10-CM | POA: Insufficient documentation

## 2016-11-23 DIAGNOSIS — I10 Essential (primary) hypertension: Secondary | ICD-10-CM | POA: Insufficient documentation

## 2016-11-23 DIAGNOSIS — Z794 Long term (current) use of insulin: Secondary | ICD-10-CM | POA: Insufficient documentation

## 2016-11-23 DIAGNOSIS — E119 Type 2 diabetes mellitus without complications: Secondary | ICD-10-CM | POA: Insufficient documentation

## 2016-11-23 HISTORY — DX: Gastro-esophageal reflux disease without esophagitis: K21.9

## 2016-11-23 HISTORY — DX: Essential (primary) hypertension: I10

## 2016-11-23 MED ORDER — AMOXICILLIN-POT CLAVULANATE 875-125 MG PO TABS: 1.0000 | ORAL_TABLET | Freq: Two times a day (BID) | ORAL | 0 refills | Status: AC

## 2016-11-23 MED ORDER — HYDROCODONE-ACETAMINOPHEN 5-325 MG PO TABS
2.0000 | ORAL_TABLET | Freq: Once | ORAL | Status: AC
  Administered 2016-11-23: 2 via ORAL
  Filled 2016-11-23: qty 2

## 2016-11-23 MED ORDER — DOCUSATE SODIUM 50 MG/5ML PO LIQD
50.0000 mg | Freq: Once | ORAL | Status: AC
  Administered 2016-11-23: 50 mg via OTIC
  Filled 2016-11-23: qty 10

## 2016-11-23 MED ORDER — CIPROFLOXACIN-DEXAMETHASONE 0.3-0.1 % OT SUSP
4.0000 [drp] | Freq: Two times a day (BID) | OTIC | Status: DC
  Administered 2016-11-23: 4 [drp] via OTIC
  Filled 2016-11-23 (×2): qty 7.5

## 2016-11-23 MED ORDER — HYDROCODONE-ACETAMINOPHEN 5-325 MG PO TABS: 1.0000 | ORAL_TABLET | ORAL | 0 refills | Status: DC | PRN

## 2016-11-23 MED ORDER — LORATADINE 10 MG PO TABS: 10.0000 mg | ORAL_TABLET | Freq: Every day | ORAL | 0 refills | Status: DC

## 2016-11-23 MED FILL — AMOX-CLAV 875-125 MG TABLET: 875-125 | 10 days supply | Qty: 20 | Fill #0

## 2016-11-23 MED FILL — LORATADINE 10 MG TABLET: 10 | 100 days supply | Qty: 100 | Fill #0

## 2016-11-23 MED FILL — HYDROCODON-APAP 5-325: 5-325 | 2 days supply | Qty: 10 | Fill #0

## 2016-11-23 NOTE — ED Notes (Signed)
ED Provider at bedside. 

## 2016-11-23 NOTE — ED Triage Notes (Signed)
Pt reports R ear pain with decreased hearing x1wk. Denies fever, n/v/d, sore throat, ear drainage. Denies pain meds PTA today.

## 2016-11-23 NOTE — ED Provider Notes (Addendum)
[facility] Provider Note   CSN: [id number] Arrival date & time: 11/23/16  4098     History   Chief Complaint Chief Complaint  Patient presents with  . Otalgia    HPI Tricia Boone is a 26 y.o. female.  HPI   26 year old female with history of type 1 diabetes, high cholesterol, here with right ear pain. The patient states her symptoms started approximately 1 week ago. She denies any trauma to the ear. The pain started as an aching, throbbing pain in her right ear with associated decreased hearing. She has had some mild nasal congestion for the week leading up to this. Over the last week, her pain has progressively worsened. She tried to use a Q-tip to clean out her ear and this mildly improved her symptoms but they quickly returned. The pain is an aching, throbbing pain that is worse with pressure over the ear. Denies any mastoid pain or tenderness. No headache. No neck stiffness. She denies any fevers. She has been taking her blood sugars and state they have been at their baseline. No other medical complaints. No alleviating factors.  Past Medical History:  Diagnosis Date  . Diabetes mellitus without complication (HCC)   . GERD (gastroesophageal reflux disease)   . High cholesterol   . Hypertension     Patient Active Problem List   Diagnosis Date Noted  . DKA (diabetic ketoacidoses) (HCC) 09/18/2016  . Metabolic acidosis 09/18/2016  . Acute encephalopathy 09/18/2016  . Type 1 diabetes mellitus (HCC) 09/18/2016  . SIRS (systemic inflammatory response syndrome) (HCC) 09/18/2016  . Renal insufficiency 09/18/2016  . Kidney disease 09/18/2016  . Intractable vomiting     Past Surgical History:  Procedure Laterality Date  . INCISION AND DRAINAGE BREAST ABSCESS      OB History    No data available       Home Medications    Prior to Admission medications   Medication Sig Start Date End Date Taking? Authorizing Provider  insulin aspart (NOVOLOG) 100  UNIT/ML injection Inject 25 Units into the skin 3 (three) times daily before meals.    Yes [provider]  insulin detemir (LEVEMIR) 100 UNIT/ML injection Inject 25 Units into the skin at bedtime.    Yes [provider]  metoCLOPramide (REGLAN) 5 MG tablet Take 1 tablet (5 mg total) by mouth every 6 (six) hours as needed for nausea. 09/24/16  Yes Albertine Grates, MD  pantoprazole (PROTONIX) 40 MG tablet Take 40 mg by mouth daily.   Yes [provider]  acetaminophen (TYLENOL) 325 MG tablet Take 650 mg by mouth every 6 (six) hours as needed.    [provider]  amoxicillin-clavulanate (AUGMENTIN) 875-125 MG tablet Take 1 tablet by mouth every 12 (twelve) hours. 11/23/16 12/03/16  Shaune Pollack, MD  famotidine (PEPCID) 20 MG tablet Take 1 tablet (20 mg total) by mouth at bedtime. 09/24/16   Albertine Grates, MD  lisinopril (PRINIVIL,ZESTRIL) 5 MG tablet Take 1 tablet (5 mg total) by mouth daily. 09/24/16   Albertine Grates, MD  ondansetron (ZOFRAN ODT) 8 MG disintegrating tablet Take 1 tablet (8 mg total) by mouth every 8 (eight) hours as needed for nausea or vomiting. Patient not taking: Reported on 09/18/2016 05/13/16   Molpus, John, MD  potassium chloride SA (K-DUR,KLOR-CON) 20 MEQ tablet Take 2 tablets (40 mEq total) by mouth daily. 09/24/16   Albertine Grates, MD    Family History No family history on file.  Social History Social History  Substance Use Topics  . Smoking status: Never Smoker  . Smokeless tobacco: Never Used  . Alcohol use No     Allergies   Patient has no known allergies.   Review of Systems Review of Systems  Constitutional: Negative for chills and fever.  HENT: Positive for congestion, ear pain and rhinorrhea. Negative for sore throat.   Eyes: Negative for visual disturbance.  Respiratory: Negative for cough, shortness of breath and wheezing.   Cardiovascular: Negative for chest pain and leg swelling.  Gastrointestinal: Negative for abdominal pain, diarrhea,  nausea and vomiting.  Genitourinary: Negative for dysuria, flank pain, vaginal bleeding and vaginal discharge.  Musculoskeletal: Negative for neck pain.  Skin: Negative for rash.  Allergic/Immunologic: Negative for immunocompromised state.  Neurological: Negative for syncope and headaches.  Hematological: Does not bruise/bleed easily.  All other systems reviewed and are negative.    Physical Exam Updated Vital Signs BP 124/89 (BP Location: Right Arm)   Pulse (!) 109   Temp 98.2 F (36.8 C) (Oral)   Resp 16   Ht 5' 2.5" (1.588 m)   Wt 55.3 kg (122 lb)   LMP 11/04/2016   SpO2 100%   BMI 21.96 kg/m   Physical Exam  Constitutional: She is oriented to person, place, and time. She appears well-developed and well-nourished. No distress.  HENT:  Head: Normocephalic and atraumatic.  Initial examination shows cerumen impaction of right external auditory canal. Following removal, visualized tympanic membrane is intact but erythematous and opaque. There is minimal erythema of the external canal with no tragal tenderness or tenderness with manipulation of the external ear. No debris.. No mastoid erythema, tenderness, or swelling. No facial swelling. Mild tender anterior cervical LAD along right neck with no posterior/postauricular LAD or tenderness. Moderate nasal congestion.  Eyes: Conjunctivae are normal.  Neck: Neck supple.  Cardiovascular: Normal rate, regular rhythm and normal heart sounds.  Exam reveals no friction rub.   No murmur heard. Pulmonary/Chest: Effort normal and breath sounds normal. No respiratory distress. She has no wheezes. She has no rales.  Abdominal: She exhibits no distension.  Musculoskeletal: She exhibits no edema.  Neurological: She is alert and oriented to person, place, and time. She exhibits normal muscle tone.  Skin: Skin is warm. Capillary refill takes less than 2 seconds.  Psychiatric: She has a normal mood and affect.  Nursing note and vitals  reviewed.    ED Treatments / Results  Labs (all labs ordered are listed, but only abnormal results are displayed) Labs Reviewed - No data to display  EKG  EKG Interpretation None       Radiology No results found.  Procedures .Ear Cerumen Removal Date/Time: 12/08/2016 1:29 PM Performed by: Shaune PollackISAACS, Toy Eisemann Authorized by: Shaune PollackISAACS, Cosandra Plouffe   Consent:    Consent obtained:  Verbal   Consent given by:  Patient   Risks discussed:  Bleeding, incomplete removal, pain and TM perforation   Alternatives discussed:  Alternative treatment Procedure details:    Location:  R ear   Procedure type: irrigation   Post-procedure details:    Inspection:  TM intact   Hearing quality:  Improved   Patient tolerance of procedure:  Tolerated well, no immediate complications   (including critical care time)  Medications Ordered in ED Medications  ciprofloxacin-dexamethasone (CIPRODEX) 0.3-0.1 % otic suspension 4 drop (not administered)  docusate (COLACE) 50 MG/5ML liquid 50 mg (50 mg Right Ear Given 11/23/16 0800)  HYDROcodone-acetaminophen (NORCO/VICODIN) 5-325 MG per tablet 2 tablet (2 tablets Oral Given 11/23/16  1610)     Initial Impression / Assessment and Plan / ED Course  I have reviewed the triage vital signs and the nursing notes.  Pertinent labs & imaging results that were available during my care of the patient were reviewed by me and considered in my medical decision making (see chart for details).     26 yo F with h/o T1DM, HTN, HLD here with right ear pain x 1 week. On arrival, pt mildly tachycardic likely 2/2 pain. No fever. Exam as above, c/w cerumen impaction. Cerumen removed via colace/irrigation - visualized TM appears erythematous and opaque c/w AOM. There is minimal erythema of EAC though this may be 2/2 cerumen impaction; however, given h/o T1DM, will start ciprodex as well to help with inflammation/possible early OE. No mastoid erythema, tenderness, or swelling; no debris  within EAC; no fevers, no headache, no signs of systemic illness to suggest significant OE or malignant OE. Will treat with augmentin, ciprodex, d/c home. Of note, tp HR low 100s - this is baseline per review of records and pt has HR>100 in nearly all ER and clinic visits in [facility], up to 110s in multiple clinic visits. Given well appearance, absence of fever, do not suspect sepsis. Good return precautions given.  Final Clinical Impressions(s) / ED Diagnoses   Final diagnoses:  Impacted cerumen of right ear  Acute suppurative otitis media of right ear without spontaneous rupture of tympanic membrane, recurrence not specified    New Prescriptions New Prescriptions   AMOXICILLIN-CLAVULANATE (AUGMENTIN) 875-125 MG TABLET    Take 1 tablet by mouth every 12 (twelve) hours.     Shaune Pollack, MD 11/23/16 9604    Shaune Pollack, MD 12/08/16 1330

## 2017-01-08 ENCOUNTER — Emergency Department (HOSPITAL_BASED_OUTPATIENT_CLINIC_OR_DEPARTMENT_OTHER): Payer: Self-pay

## 2017-01-08 ENCOUNTER — Inpatient Hospital Stay (HOSPITAL_BASED_OUTPATIENT_CLINIC_OR_DEPARTMENT_OTHER)
Admission: EM | Admit: 2017-01-08 | Discharge: 2017-01-08 | DRG: 638 | Disposition: A | Discharge status: Home / Self Care | Payer: Self-pay | Attending: Internal Medicine | Admitting: Internal Medicine

## 2017-01-08 ENCOUNTER — Encounter (HOSPITAL_BASED_OUTPATIENT_CLINIC_OR_DEPARTMENT_OTHER): Payer: Self-pay | Admitting: Emergency Medicine

## 2017-01-08 DIAGNOSIS — K219 Gastro-esophageal reflux disease without esophagitis: Secondary | ICD-10-CM | POA: Diagnosis present

## 2017-01-08 DIAGNOSIS — E1043 Type 1 diabetes mellitus with diabetic autonomic (poly)neuropathy: Secondary | ICD-10-CM | POA: Diagnosis present

## 2017-01-08 DIAGNOSIS — E872 Acidosis, unspecified: Secondary | ICD-10-CM

## 2017-01-08 DIAGNOSIS — E860 Dehydration: Secondary | ICD-10-CM | POA: Diagnosis present

## 2017-01-08 DIAGNOSIS — E1110 Type 2 diabetes mellitus with ketoacidosis without coma: Secondary | ICD-10-CM | POA: Diagnosis present

## 2017-01-08 DIAGNOSIS — I10 Essential (primary) hypertension: Secondary | ICD-10-CM | POA: Diagnosis present

## 2017-01-08 DIAGNOSIS — E1010 Type 1 diabetes mellitus with ketoacidosis without coma: Principal | ICD-10-CM | POA: Diagnosis present

## 2017-01-08 DIAGNOSIS — Z9119 Patient's noncompliance with other medical treatment and regimen: Secondary | ICD-10-CM

## 2017-01-08 DIAGNOSIS — E7800 Pure hypercholesterolemia, unspecified: Secondary | ICD-10-CM | POA: Diagnosis present

## 2017-01-08 DIAGNOSIS — Z79899 Other long term (current) drug therapy: Secondary | ICD-10-CM

## 2017-01-08 DIAGNOSIS — K3184 Gastroparesis: Secondary | ICD-10-CM | POA: Diagnosis present

## 2017-01-08 DIAGNOSIS — R6510 Systemic inflammatory response syndrome (SIRS) of non-infectious origin without acute organ dysfunction: Secondary | ICD-10-CM | POA: Diagnosis present

## 2017-01-08 DIAGNOSIS — N179 Acute kidney failure, unspecified: Secondary | ICD-10-CM | POA: Diagnosis present

## 2017-01-08 LAB — COMPREHENSIVE METABOLIC PANEL
ALT: 32 U/L (ref 14–54)
AST: 31 U/L (ref 15–41)
Albumin: 4.4 g/dL (ref 3.5–5.0)
Alkaline Phosphatase: 106 U/L (ref 38–126)
Anion gap: 23 — ABNORMAL HIGH (ref 5–15)
BILIRUBIN TOTAL: 1.2 mg/dL (ref 0.3–1.2)
BUN: 33 mg/dL — AB (ref 6–20)
CALCIUM: 10 mg/dL (ref 8.9–10.3)
CO2: 15 mmol/L — ABNORMAL LOW (ref 22–32)
CREATININE: 2.13 mg/dL — AB (ref 0.44–1.00)
Chloride: 96 mmol/L — ABNORMAL LOW (ref 101–111)
GFR calc Af Amer: 36 mL/min — ABNORMAL LOW (ref >60)
GFR, EST NON AFRICAN AMERICAN: 31 mL/min — AB (ref >60)
Glucose, Bld: 414 mg/dL — ABNORMAL HIGH (ref 65–99)
Potassium: 4.5 mmol/L (ref 3.5–5.1)
Sodium: 134 mmol/L — ABNORMAL LOW (ref 135–145)
Total Protein: 8.8 g/dL — ABNORMAL HIGH (ref 6.5–8.1)

## 2017-01-08 LAB — GLUCOSE, CAPILLARY
GLUCOSE-CAPILLARY: 123 mg/dL — AB (ref 65–99)
GLUCOSE-CAPILLARY: 124 mg/dL — AB (ref 65–99)
Glucose-Capillary: 157 mg/dL — ABNORMAL HIGH (ref 65–99)
Glucose-Capillary: 150 mg/dL — ABNORMAL HIGH (ref 65–99)
Glucose-Capillary: 128 mg/dL — ABNORMAL HIGH (ref 65–99)
Glucose-Capillary: 116 mg/dL — ABNORMAL HIGH (ref 65–99)
Glucose-Capillary: 106 mg/dL — ABNORMAL HIGH (ref 65–99)
Glucose-Capillary: 177 mg/dL — ABNORMAL HIGH (ref 65–99)

## 2017-01-08 LAB — BASIC METABOLIC PANEL
ANION GAP: 8 (ref 5–15)
ANION GAP: 10 (ref 5–15)
BUN: 25 mg/dL — ABNORMAL HIGH (ref 6–20)
BUN: 22 mg/dL — ABNORMAL HIGH (ref 6–20)
CALCIUM: 8.5 mg/dL — AB (ref 8.9–10.3)
CALCIUM: 8.5 mg/dL — AB (ref 8.9–10.3)
CHLORIDE: 108 mmol/L (ref 101–111)
CHLORIDE: 112 mmol/L — AB (ref 101–111)
CO2: 22 mmol/L (ref 22–32)
CO2: 17 mmol/L — AB (ref 22–32)
Creatinine, Ser: 1.3 mg/dL — ABNORMAL HIGH (ref 0.44–1.00)
Creatinine, Ser: 1.05 mg/dL — ABNORMAL HIGH (ref 0.44–1.00)
GFR calc non Af Amer: 56 mL/min — ABNORMAL LOW (ref >60)
GFR calc non Af Amer: >60 mL/min (ref >60)
Glucose, Bld: 157 mg/dL — ABNORMAL HIGH (ref 65–99)
Glucose, Bld: 127 mg/dL — ABNORMAL HIGH (ref 65–99)
POTASSIUM: 4.1 mmol/L (ref 3.5–5.1)
POTASSIUM: 4.8 mmol/L (ref 3.5–5.1)
Sodium: 138 mmol/L (ref 135–145)
Sodium: 139 mmol/L (ref 135–145)

## 2017-01-08 LAB — PREGNANCY, URINE: PREG TEST UR: NEGATIVE

## 2017-01-08 LAB — MRSA PCR SCREENING: MRSA by PCR: NEGATIVE

## 2017-01-08 LAB — URINALYSIS, ROUTINE W REFLEX MICROSCOPIC
HGB URINE DIPSTICK: NEGATIVE
Ketones, ur: 40 mg/dL — AB
Leukocytes, UA: NEGATIVE
Nitrite: NEGATIVE
PROTEIN: 30 mg/dL — AB
Specific Gravity, Urine: 1.026 (ref 1.005–1.030)
pH: 5.5 (ref 5.0–8.0)

## 2017-01-08 LAB — TROPONIN I

## 2017-01-08 LAB — URINALYSIS, MICROSCOPIC (REFLEX)

## 2017-01-08 LAB — CBC
HEMATOCRIT: 30.4 % — AB (ref 36.0–46.0)
HEMOGLOBIN: 10.3 g/dL — AB (ref 12.0–15.0)
MCH: 26.6 pg (ref 26.0–34.0)
MCHC: 33.9 g/dL (ref 30.0–36.0)
MCV: 78.6 fL (ref 78.0–100.0)
Platelets: 297 10*3/uL (ref 150–400)
RBC: 3.87 MIL/uL (ref 3.87–5.11)
RDW: 16.2 % — ABNORMAL HIGH (ref 11.5–15.5)
WBC: 12 10*3/uL — ABNORMAL HIGH (ref 4.0–10.5)

## 2017-01-08 LAB — CBC WITH DIFFERENTIAL/PLATELET
BASOS ABS: 0.1 10*3/uL (ref 0.0–0.1)
Basophils Relative: 0 %
Eosinophils Absolute: 0 10*3/uL (ref 0.0–0.7)
Eosinophils Relative: 0 %
HEMATOCRIT: 37.8 % (ref 36.0–46.0)
Hemoglobin: 13 g/dL (ref 12.0–15.0)
LYMPHS PCT: 19 %
Lymphs Abs: 2.4 10*3/uL (ref 0.7–4.0)
MCH: 27.1 pg (ref 26.0–34.0)
MCHC: 34.4 g/dL (ref 30.0–36.0)
MCV: 78.9 fL (ref 78.0–100.0)
Monocytes Absolute: 0.7 10*3/uL (ref 0.1–1.0)
Monocytes Relative: 5 %
NEUTROS ABS: 9.7 10*3/uL — AB (ref 1.7–7.7)
Neutrophils Relative %: 76 %
Platelets: 367 10*3/uL (ref 150–400)
RBC: 4.79 MIL/uL (ref 3.87–5.11)
RDW: 16 % — ABNORMAL HIGH (ref 11.5–15.5)
WBC: 12.8 10*3/uL — AB (ref 4.0–10.5)

## 2017-01-08 LAB — CBG MONITORING, ED
GLUCOSE-CAPILLARY: 347 mg/dL — AB (ref 65–99)
Glucose-Capillary: 260 mg/dL — ABNORMAL HIGH (ref 65–99)

## 2017-01-08 LAB — LACTIC ACID, PLASMA: LACTIC ACID, VENOUS: 1 mmol/L (ref 0.5–1.9)

## 2017-01-08 LAB — I-STAT CG4 LACTIC ACID, ED: Lactic Acid, Venous: 4.25 mmol/L (ref 0.5–1.9)

## 2017-01-08 LAB — LIPASE, BLOOD: LIPASE: 31 U/L (ref 11–51)

## 2017-01-08 MED ORDER — VANCOMYCIN HCL IN DEXTROSE 1-5 GM/200ML-% IV SOLN
1000.0000 mg | Freq: Once | INTRAVENOUS | Status: AC
  Administered 2017-01-08: 1000 mg via INTRAVENOUS
  Filled 2017-01-08: qty 200

## 2017-01-08 MED ORDER — INSULIN DETEMIR 100 UNIT/ML ~~LOC~~ SOLN: 30.0000 [IU] | Freq: Two times a day (BID) | SUBCUTANEOUS | 0 refills | Status: DC

## 2017-01-08 MED ORDER — SODIUM CHLORIDE 0.9 % IV SOLN: INTRAVENOUS | Status: DC

## 2017-01-08 MED ORDER — SODIUM CHLORIDE 0.9 % IV BOLUS (SEPSIS)
1000.0000 mL | Freq: Once | INTRAVENOUS | Status: AC
  Administered 2017-01-08: 1000 mL via INTRAVENOUS

## 2017-01-08 MED ORDER — INSULIN REGULAR HUMAN 100 UNIT/ML IJ SOLN
INTRAMUSCULAR | Status: AC
  Filled 2017-01-08: qty 1

## 2017-01-08 MED ORDER — SODIUM CHLORIDE 0.9 % IV SOLN
INTRAVENOUS | Status: DC
  Filled 2017-01-08: qty 1

## 2017-01-08 MED ORDER — INSULIN DETEMIR 100 UNIT/ML ~~LOC~~ SOLN
30.0000 [IU] | Freq: Two times a day (BID) | SUBCUTANEOUS | Status: DC
  Administered 2017-01-08: 30 [IU] via SUBCUTANEOUS
  Filled 2017-01-08 (×2): qty 0.3

## 2017-01-08 MED ORDER — PIPERACILLIN-TAZOBACTAM 3.375 G IVPB: 3.3750 g | Freq: Three times a day (TID) | INTRAVENOUS | Status: DC

## 2017-01-08 MED ORDER — POTASSIUM CHLORIDE IN NACL 20-0.9 MEQ/L-% IV SOLN
Freq: Once | INTRAVENOUS | Status: AC
  Administered 2017-01-08: 02:00:00 via INTRAVENOUS
  Filled 2017-01-08: qty 1000

## 2017-01-08 MED ORDER — POTASSIUM CHLORIDE CRYS ER 20 MEQ PO TBCR
20.0000 meq | EXTENDED_RELEASE_TABLET | Freq: Once | ORAL | Status: AC
  Administered 2017-01-08: 20 meq via ORAL
  Filled 2017-01-08: qty 1

## 2017-01-08 MED ORDER — DEXTROSE-NACL 5-0.45 % IV SOLN
INTRAVENOUS | Status: DC
  Administered 2017-01-08: 05:00:00 via INTRAVENOUS

## 2017-01-08 MED ORDER — INSULIN ASPART 100 UNIT/ML ~~LOC~~ SOLN
0.0000 [IU] | Freq: Three times a day (TID) | SUBCUTANEOUS | Status: DC
  Administered 2017-01-08: 3 [IU] via SUBCUTANEOUS

## 2017-01-08 MED ORDER — VANCOMYCIN HCL IN DEXTROSE 750-5 MG/150ML-% IV SOLN
750.0000 mg | INTRAVENOUS | Status: DC
Start: 2017-01-08 — End: 2017-01-08

## 2017-01-08 MED ORDER — SODIUM CHLORIDE 0.9 % IV SOLN
INTRAVENOUS | Status: DC
  Administered 2017-01-08: 3.5 [IU]/h via INTRAVENOUS
  Filled 2017-01-08: qty 1

## 2017-01-08 MED ORDER — INSULIN DETEMIR 100 UNIT/ML ~~LOC~~ SOLN
25.0000 [IU] | Freq: Every day | SUBCUTANEOUS | Status: DC
  Filled 2017-01-08: qty 0.25

## 2017-01-08 MED ORDER — INSULIN DETEMIR 100 UNIT/ML ~~LOC~~ SOLN
30.0000 [IU] | Freq: Every day | SUBCUTANEOUS | Status: DC
Start: 2017-01-08 — End: 2017-01-08

## 2017-01-08 MED ORDER — PIPERACILLIN-TAZOBACTAM 3.375 G IVPB 30 MIN
3.3750 g | Freq: Once | INTRAVENOUS | Status: AC
  Administered 2017-01-08: 3.375 g via INTRAVENOUS
  Filled 2017-01-08 (×2): qty 50

## 2017-01-08 MED ORDER — ENOXAPARIN SODIUM 40 MG/0.4ML ~~LOC~~ SOLN
40.0000 mg | SUBCUTANEOUS | Status: DC
Start: 2017-01-08 — End: 2017-01-08
  Filled 2017-01-08: qty 0.4

## 2017-01-08 MED ORDER — DEXTROSE-NACL 5-0.45 % IV SOLN: INTRAVENOUS | Status: DC

## 2017-01-08 NOTE — ED Notes (Signed)
Pt family requested for patient to have IV team consults only, request was told to the nurse that received report.

## 2017-01-08 NOTE — ED Notes (Signed)
ED Provider at bedside. 

## 2017-01-08 NOTE — ED Notes (Addendum)
Patient is very lethargic has to be assisted to transfer from wheelchair to bed.  Denies pain.  Fiancee at bedside.  Pt tried to go to work but she did verbalized to her fiancee that she is not feeling good.  Pt started throwing up at her work place and her fiancee has to pick her up and [facility]

## 2017-01-08 NOTE — H&P (Signed)
History and Physical    Tricia GrayLakeyvia A Paolucci ZOX:[id number] DOB: Nov 27, 1990 DOA: 01/08/2017  PCP: Patient, No Pcp Per  Patient coming from: Home.  Chief Complaint: Dizziness nausea vomiting.  HPI: Tricia Boone is a 26 y.o. female with history of diabetes mellitus type 1 since the ER with complaints of dizziness and nausea vomiting with leg cramps symptoms present for the last 24 hours. Denies any chest pain or shortness of breath headache or visual symptoms or any focal deficits. Not sure patient was compliant with her medications.   ED Course: In the ER patient was found to have blood sugar of 414 with anion gap of 23. Lactic acid also was elevated. Since initial presentation patient was tachycardic with elevated lactate levels and leukocytosis ER physician started patient on sepsis protocol with fluid bolus antibiotics and blood cultures. Eventually labs revealed patient is an DKA and started on insulin infusion.  Review of Systems: As per HPI, rest all negative.   Past Medical History:  Diagnosis Date  . Diabetes mellitus without complication (HCC)   . GERD (gastroesophageal reflux disease)   . High cholesterol   . Hypertension     Past Surgical History:  Procedure Laterality Date  . INCISION AND DRAINAGE BREAST ABSCESS       reports that she has never smoked. She has never used smokeless tobacco. She reports that she does not drink alcohol or use drugs.  No Known Allergies  Family History  Problem Relation Age of Onset  . Hypertension Other     Prior to Admission medications   Medication Sig Start Date End Date Taking? Authorizing Provider  acetaminophen (TYLENOL) 325 MG tablet Take 650 mg by mouth every 6 (six) hours as needed.    [provider]  famotidine (PEPCID) 20 MG tablet Take 1 tablet (20 mg total) by mouth at bedtime. 09/24/16   Albertine GratesXu, Fang, MD  HYDROcodone-acetaminophen (NORCO/VICODIN) 5-325 MG tablet Take 1-2 tablets by mouth every 4 (four) hours  as needed for moderate pain or severe pain. 11/23/16   Shaune PollackIsaacs, Cameron, MD  insulin aspart (NOVOLOG) 100 UNIT/ML injection Inject 25 Units into the skin 3 (three) times daily before meals.     [provider]  insulin detemir (LEVEMIR) 100 UNIT/ML injection Inject 25 Units into the skin at bedtime.     [provider]  lisinopril (PRINIVIL,ZESTRIL) 5 MG tablet Take 1 tablet (5 mg total) by mouth daily. 09/24/16   Albertine GratesXu, Fang, MD  loratadine (CLARITIN) 10 MG tablet Take 1 tablet (10 mg total) by mouth daily. 11/23/16 12/03/16  Shaune PollackIsaacs, Cameron, MD  metoCLOPramide (REGLAN) 5 MG tablet Take 1 tablet (5 mg total) by mouth every 6 (six) hours as needed for nausea. 09/24/16   Albertine GratesXu, Fang, MD  ondansetron (ZOFRAN ODT) 8 MG disintegrating tablet Take 1 tablet (8 mg total) by mouth every 8 (eight) hours as needed for nausea or vomiting. Patient not taking: Reported on 09/18/2016 05/13/16   Molpus, John, MD  pantoprazole (PROTONIX) 40 MG tablet Take 40 mg by mouth daily.    [provider]  potassium chloride SA (K-DUR,KLOR-CON) 20 MEQ tablet Take 2 tablets (40 mEq total) by mouth daily. 09/24/16   Albertine GratesXu, Fang, MD    Physical Exam: Vitals:   01/08/17 0230 01/08/17 0245 01/08/17 0300 01/08/17 0417  BP: (!) 95/56 96/61 100/65 109/73  Pulse: (!) 104 (!) 106 (!) 105   Resp: 12 (!) 7 20   Temp:    98.1 F (36.7  C)  TempSrc:    Oral  SpO2: 99% 99% 99%   Weight:    56.1 kg (123 lb 10.9 oz)  Height:    5' 2.5" (1.588 m)      Constitutional: Moderately built and nourished. Vitals:   01/08/17 0230 01/08/17 0245 01/08/17 0300 01/08/17 0417  BP: (!) 95/56 96/61 100/65 109/73  Pulse: (!) 104 (!) 106 (!) 105   Resp: 12 (!) 7 20   Temp:    98.1 F (36.7 C)  TempSrc:    Oral  SpO2: 99% 99% 99%   Weight:    56.1 kg (123 lb 10.9 oz)  Height:    5' 2.5" (1.588 m)   Eyes: Anicteric. no pallor. ENMT: No discharge from the ears eyes nose and mouth. Neck: No mass felt. No JVD  appreciated. Respiratory: No rhonchi or crepitations. Cardiovascular: S1-S2. No murmurs appreciated. Abdomen: Soft nontender bowel sounds present. Musculoskeletal: No edema. No joint effusion. Skin: No rash. Skin appears warm. Neurologic: Patient is sleepy but follows commands and moves all extremities and is oriented to time place and person. Psychiatric: Appears normal.   Labs on Admission: I have personally reviewed following labs and imaging studies  CBC:  Recent Labs Lab 01/08/17 0026  WBC 12.8*  NEUTROABS 9.7*  HGB 13.0  HCT 37.8  MCV 78.9  PLT 367   Basic Metabolic Panel:  Recent Labs Lab 01/08/17 0026  NA 134*  K 4.5  CL 96*  CO2 15*  GLUCOSE 414*  BUN 33*  CREATININE 2.13*  CALCIUM 10.0   GFR: Estimated Creatinine Clearance: 32.1 mL/min (A) (by C-G formula based on SCr of 2.13 mg/dL (H)). Liver Function Tests:  Recent Labs Lab 01/08/17 0026  AST 31  ALT 32  ALKPHOS 106  BILITOT 1.2  PROT 8.8*  ALBUMIN 4.4    Recent Labs Lab 01/08/17 0026  LIPASE 31   No results for input(s): AMMONIA in the last 168 hours. Coagulation Profile: No results for input(s): INR, PROTIME in the last 168 hours. Cardiac Enzymes: No results for input(s): CKTOTAL, CKMB, CKMBINDEX, TROPONINI in the last 168 hours. BNP (last 3 results) No results for input(s): PROBNP in the last 8760 hours. HbA1C: No results for input(s): HGBA1C in the last 72 hours. CBG:  Recent Labs Lab 01/08/17 0011 01/08/17 0306 01/08/17 0434  GLUCAP 347* 260* 157*   Lipid Profile: No results for input(s): CHOL, HDL, LDLCALC, TRIG, CHOLHDL, LDLDIRECT in the last 72 hours. Thyroid Function Tests: No results for input(s): TSH, T4TOTAL, FREET4, T3FREE, THYROIDAB in the last 72 hours. Anemia Panel: No results for input(s): VITAMINB12, FOLATE, FERRITIN, TIBC, IRON, RETICCTPCT in the last 72 hours. Urine analysis:    Component Value Date/Time   COLORURINE YELLOW 01/08/2017 0251    APPEARANCEUR CLEAR 01/08/2017 0251   LABSPEC 1.026 01/08/2017 0251   PHURINE 5.5 01/08/2017 0251   GLUCOSEU >=500 (A) 01/08/2017 0251   HGBUR NEGATIVE 01/08/2017 0251   BILIRUBINUR MODERATE (A) 01/08/2017 0251   KETONESUR 40 (A) 01/08/2017 0251   PROTEINUR 30 (A) 01/08/2017 0251   UROBILINOGEN 0.2 05/15/2015 1000   NITRITE NEGATIVE 01/08/2017 0251   LEUKOCYTESUR NEGATIVE 01/08/2017 0251   Sepsis Labs: @LABRCNTIP (procalcitonin:4,lacticidven:4) )No results found for this or any previous visit (from the past 240 hour(s)).   Radiological Exams on Admission: Dg Chest 2 View  Result Date: 01/08/2017 CLINICAL DATA:  Acute onset of generalized weakness and lethargy. Initial encounter. EXAM: CHEST  2 VIEW COMPARISON:  Chest radiograph performed 09/18/2016 FINDINGS:  The lungs are well-aerated and clear. There is no evidence of focal opacification, pleural effusion or pneumothorax. The heart is normal in size; the mediastinal contour is within normal limits. No acute osseous abnormalities are seen. Bilateral metallic nipple piercings are noted. IMPRESSION: No acute cardiopulmonary process seen. Electronically Signed   By: Roanna Raider M.D.   On: 01/08/2017 02:29    EKG: Independently reviewed. Sinus tachycardia.  Assessment/Plan Principal Problem:   DKA (diabetic ketoacidoses) (HCC) Active Problems:   ARF (acute renal failure) (HCC)    1. DKA and type 1 diabetes - not sure about the compliance. Check hemoglobin A1c. Patient is on aggressive fluid hydration and IV insulin infusion. Follow metabolic panel closely. Recheck lactate levels. 2. SIRS likely secondary to DKA no definite signs of any infection. Blood cultures are obtained. Initially patient was started on antibiotics but since there is no definite signs of infection antibiotics have been discontinued for now. Closely observe. 3. Acute renal failure probably from dehydration nausea vomiting and DKA. Continue hydration closely follow  intake output and metabolic panel. 4. Nausea vomiting likely from gastroparesis or DKA. Follow clinically.   DVT prophylaxis: Lovenox. Code Status: Full code.  Family Communication: Discussed with patient.  Disposition Plan: Home.  Consults called: None.  Admission status: Inpatient.    Eduard Clos MD [facility] Pager [phone].  If 7PM-7AM, please contact night-coverage [email or web address] Password [email or web address]  01/08/2017, 5:16 AM

## 2017-01-08 NOTE — Progress Notes (Signed)
Pharmacy Antibiotic Note  Tricia Boone is a 26 y.o. female admitted on 01/08/2017 with rule out sepsis.  Pharmacy has been consulted for Vancomycin/Zosyn dosing. Pt presents to the ED with weakness and dizziness. Found to have mildly elevated WBC, acute renal failure, elevated lactic acid. CXR unremarkable. Pt noted to be in DKA.   Plan: Vancomycin 750 mg IV q24h, increase dose when renal function improves Zosyn 3.375G IV q8h to be infused over 4 hours Trend WBC, temp, renal function  F/U infectious work-up Drug levels as indicated   Height: 5' 2.5" (158.8 cm) Weight: 123 lb 10.9 oz (56.1 kg) IBW/kg (Calculated) : 51.25  Temp (24hrs), Avg:97.8 F (36.6 C), Min:97.5 F (36.4 C), Max:98.1 F (36.7 C)   Recent Labs Lab 01/08/17 0026 01/08/17 0040  WBC 12.8*  --   CREATININE 2.13*  --   LATICACIDVEN  --  4.25*    Estimated Creatinine Clearance: 32.1 mL/min (A) (by C-G formula based on SCr of 2.13 mg/dL (H)).    No Known Allergies   Tricia Boone, Tricia Boone 01/08/2017 4:57 AM

## 2017-01-08 NOTE — ED Triage Notes (Signed)
Patient states that she is weak and dizzy - this started acutely at 2000. Patient is listless in chair in triage with decreased BP

## 2017-01-08 NOTE — Discharge Summary (Addendum)
Physician Discharge Summary  Tricia Boone ZOX:[id number] DOB: 04-16-1991 DOA: 01/08/2017  PCP: [facility]  Admit date: 01/08/2017 Discharge date: 01/08/2017  Admitted From: Home Disposition:  Home  Recommendations for Outpatient Follow-up:  1. Follow up with [facility] 2. Follow up with Endocrinology (Tricia Boone) on 7/10 as scheduled  3. Follow up on final blood culture and urine culture results, Ha1c   Discharge Condition: Stable CODE STATUS:  Full Diet recommendation: Carb modified   Brief/Interim Summary: Tricia Boone is a 26 y.o. female with history of diabetes mellitus type 1, poorly controlled, who presented to the ER with complaints of dizziness and nausea, vomiting with leg cramps symptoms present for the last 24 hours. She states that she came home from work, checked her blood sugar which was 80-90, ate pizza and went to sleep. She did not give herself any insulin. She woke up around 8pm not feeling well. In the ER, patient was found to have blood sugar of 414 with anion gap of 23. Lactic acid also was elevated. She was given IVF, empiric antibiotics, and started on insulin infusion. Labs and symptoms improved much quicker than expected and patient was stable for discharge home. She states that she has an appointment with endocrinology on Tuesday 7/10. She is encouraged to follow up and take her insulin as prescribed.  DKA and type 1 diabetes -Unclear about her compliance with insulin. Has had noncompliance issues and previous admissions for DKA in the past  -DKA resolved rapidly with IV hydration and IV insulin infusion. Anion gap closed this morning, switched to subcutaneous Levemir 30 units twice daily as well as sliding scale insulin which is her home regimen -Resume home reglan, PPI   SIRS  -Without infectious etiology, UA unremarkable  -Blood cultures ordered, patient has been afebrile, vital signs improved with supportive  care  -Initially given broad-spectrum antibiotics due to elevated lactic acidosis, lactic acidosis resolved with IV fluids  AKI -Likely prerenal from dehydration -Resolved with IV fluids    Discharge Instructions  Discharge Instructions    Call MD for:  difficulty breathing, headache or visual disturbances    Complete by:  As directed    Call MD for:  extreme fatigue    Complete by:  As directed    Call MD for:  persistant dizziness or light-headedness    Complete by:  As directed    Call MD for:  persistant nausea and vomiting    Complete by:  As directed    Call MD for:  severe uncontrolled pain    Complete by:  As directed    Call MD for:  temperature >100.4    Complete by:  As directed    Diet Carb Modified    Complete by:  As directed    Discharge instructions    Complete by:  As directed    You were cared for by a hospitalist during your hospital stay. If you have any questions about your discharge medications or the care you received while you were in the hospital after you are discharged, you can call the unit and asked to speak with the hospitalist on call if the hospitalist that took care of you is not available. Once you are discharged, your primary care physician will handle any further medical issues. Please note that NO REFILLS for any discharge medications will be authorized once you are discharged, as it is imperative that you return to your primary care physician (  or establish a relationship with a primary care physician if you do not have one) for your aftercare needs so that they can reassess your need for medications and monitor your lab values.   Increase activity slowly    Complete by:  As directed      Allergies as of 01/08/2017      Reactions   Lisinopril Nausea Only   Diaphoretic.      Medication List    STOP taking these medications   lisinopril 5 MG tablet Commonly known as:  PRINIVIL,ZESTRIL     TAKE these medications   HYDROcodone-acetaminophen  5-325 MG tablet Commonly known as:  NORCO/VICODIN Take 1-2 tablets by mouth every 4 (four) hours as needed for moderate pain or severe pain.   insulin aspart 100 UNIT/ML injection Commonly known as:  novoLOG Inject 25 Units into the skin 3 (three) times daily before meals.   insulin detemir 100 UNIT/ML injection Commonly known as:  LEVEMIR Inject 0.3 mLs (30 Units total) into the skin 2 (two) times daily. What changed:  how much to take  when to take this   loratadine 10 MG tablet Commonly known as:  CLARITIN Take 1 tablet (10 mg total) by mouth daily.   metoCLOPramide 5 MG tablet Commonly known as:  REGLAN Take 1 tablet (5 mg total) by mouth every 6 (six) hours as needed for nausea.   ondansetron 8 MG disintegrating tablet Commonly known as:  ZOFRAN ODT Take 1 tablet (8 mg total) by mouth every 8 (eight) hours as needed for nausea or vomiting.   pantoprazole 40 MG tablet Commonly known as:  PROTONIX Take 40 mg by mouth daily.      Follow-up Information    Inc, [facility]. Schedule an appointment as soon as possible for a visit in 1 week(s).   Specialty:  Pediatrics Contact information: [address] [place] [address] [phone]        Tricia DadeYounts, Tricia Rebecca, NP. Go on 01/11/2017.   Specialty:  Nurse Practitioner Contact information: [address] [place] [address] [phone]          Allergies  Allergen Reactions  . Lisinopril Nausea Only    Diaphoretic.    Consultations:  None   Procedures/Studies: Dg Chest 2 View  Result Date: 01/08/2017 CLINICAL DATA:  Acute onset of generalized weakness and lethargy. Initial encounter. EXAM: CHEST  2 VIEW COMPARISON:  Chest radiograph performed 09/18/2016 FINDINGS: The lungs are well-aerated and clear. There is no evidence of focal opacification, pleural effusion or pneumothorax. The heart is normal in size; the mediastinal contour is within normal limits. No  acute osseous abnormalities are seen. Bilateral metallic nipple piercings are noted. IMPRESSION: No acute cardiopulmonary process seen. Electronically Signed   By: Roanna RaiderJeffery  Chang M.D.   On: 01/08/2017 02:29       Discharge Exam: Vitals:   01/08/17 0800 01/08/17 1000  BP: 126/87 (!) 139/98  Pulse: 96 97  Resp: 12 15  Temp: 98.3 F (36.8 C)    Vitals:   01/08/17 0500 01/08/17 0600 01/08/17 0800 01/08/17 1000  BP: (!) 95/59 108/76 126/87 (!) 139/98  Pulse: 99 98 96 97  Resp: 13 13 12 15   Temp:   98.3 F (36.8 C)   TempSrc:   Oral   SpO2: 100% 100% 100% 100%  Weight:      Height:        General: Pt is alert, awake, not in acute distress  Cardiovascular: RRR, S1/S2 +, no rubs, no gallops Respiratory: CTA bilaterally, no wheezing, no rhonchi Abdominal: Soft, NT, ND, bowel sounds + Extremities: no edema, no cyanosis    The results of significant diagnostics from this hospitalization (including imaging, microbiology, ancillary and laboratory) are listed below for reference.     Microbiology: Recent Results (from the past 240 hour(s))  MRSA PCR Screening     Status: None   Collection Time: 01/08/17  4:30 AM  Result Value Ref Range Status   MRSA by PCR NEGATIVE NEGATIVE Final    Comment:        The GeneXpert MRSA Assay (FDA approved for NASAL specimens only), is one component of a comprehensive MRSA colonization surveillance program. It is not intended to diagnose MRSA infection nor to guide or monitor treatment for MRSA infections.      Labs: BNP (last 3 results) No results for input(s): BNP in the last 8760 hours. Basic Metabolic Panel:  Recent Labs Lab 01/08/17 0026 01/08/17 0646 01/08/17 1019  NA 134* 138 139  K 4.5 4.1 4.8  CL 96* 108 112*  CO2 15* 22 17*  GLUCOSE 414* 157* 127*  BUN 33* 25* 22*  CREATININE 2.13* 1.30* 1.05*  CALCIUM 10.0 8.5* 8.5*   Liver Function Tests:  Recent Labs Lab 01/08/17 0026  AST 31  ALT 32  ALKPHOS 106  BILITOT  1.2  PROT 8.8*  ALBUMIN 4.4    Recent Labs Lab 01/08/17 0026  LIPASE 31   No results for input(s): AMMONIA in the last 168 hours. CBC:  Recent Labs Lab 01/08/17 0026 01/08/17 0646  WBC 12.8* 12.0*  NEUTROABS 9.7*  --   HGB 13.0 10.3*  HCT 37.8 30.4*  MCV 78.9 78.6  PLT 367 297   Cardiac Enzymes:  Recent Labs Lab 01/08/17 0646  TROPONINI <0.03   BNP: Invalid input(s): POCBNP CBG:  Recent Labs Lab 01/08/17 0653 01/08/17 0752 01/08/17 0905 01/08/17 1002 01/08/17 1107  GLUCAP 128* 123* 124* 116* 106*   D-Dimer No results for input(s): DDIMER in the last 72 hours. Hgb A1c No results for input(s): HGBA1C in the last 72 hours. Lipid Profile No results for input(s): CHOL, HDL, LDLCALC, TRIG, CHOLHDL, LDLDIRECT in the last 72 hours. Thyroid function studies No results for input(s): TSH, T4TOTAL, T3FREE, THYROIDAB in the last 72 hours.  Invalid input(s): FREET3 Anemia work up No results for input(s): VITAMINB12, FOLATE, FERRITIN, TIBC, IRON, RETICCTPCT in the last 72 hours. Urinalysis    Component Value Date/Time   COLORURINE YELLOW 01/08/2017 0251   APPEARANCEUR CLEAR 01/08/2017 0251   LABSPEC 1.026 01/08/2017 0251   PHURINE 5.5 01/08/2017 0251   GLUCOSEU >=500 (A) 01/08/2017 0251   HGBUR NEGATIVE 01/08/2017 0251   BILIRUBINUR MODERATE (A) 01/08/2017 0251   KETONESUR 40 (A) 01/08/2017 0251   PROTEINUR 30 (A) 01/08/2017 0251   UROBILINOGEN 0.2 05/15/2015 1000   NITRITE NEGATIVE 01/08/2017 0251   LEUKOCYTESUR NEGATIVE 01/08/2017 0251   Sepsis Labs Invalid input(s): PROCALCITONIN,  WBC,  LACTICIDVEN Microbiology Recent Results (from the past 240 hour(s))  MRSA PCR Screening     Status: None   Collection Time: 01/08/17  4:30 AM  Result Value Ref Range Status   MRSA by PCR NEGATIVE NEGATIVE Final    Comment:        The GeneXpert MRSA Assay (FDA approved for NASAL specimens only), is one component of a comprehensive MRSA colonization surveillance  program. It is not intended to diagnose MRSA infection nor to guide  or monitor treatment for MRSA infections.      Time coordinating discharge: 40 minutes  SIGNED:  Noralee Stain, DO [facility] Pager [phone]  If 7PM-7AM, please contact night-coverage [email or web address] Password [email or web address] 01/08/2017, 11:29 AM

## 2017-01-08 NOTE — ED Provider Notes (Signed)
[facility] Provider Note   CSN: [id number] Arrival date & time: 01/08/17  0000     History   Chief Complaint Chief Complaint  Patient presents with  . Dizziness    HPI Tricia Boone is a 26 y.o. female.  HPI   26 yo F with PMHx T1DM here with nausea, vomiting, dizziness. Pt reportedly has been complaining of leg cramps and tingling for several days. She has had associated decreased appetite and elevating BG. Over the past 24 hours, she has developed mild nausea, vomiting, and dizziness with standing. Has not had anything to eat/drink today. She denies any fevers. Denies cough, SOB. No dysuria or frequency. She does note decreased UOp today. No recent med changes. Leg cramps come and go, described as tingling and cramping in b/l legs. NO recent falls or trauma. No associated numbness. No associated weakness. No alleviating or aggravating factors.  Past Medical History:  Diagnosis Date  . Diabetes mellitus without complication (HCC)   . GERD (gastroesophageal reflux disease)   . High cholesterol   . Hypertension     Patient Active Problem List   Diagnosis Date Noted  . DKA (diabetic ketoacidoses) (HCC) 09/18/2016  . Metabolic acidosis 09/18/2016  . Acute encephalopathy 09/18/2016  . Type 1 diabetes mellitus (HCC) 09/18/2016  . SIRS (systemic inflammatory response syndrome) (HCC) 09/18/2016  . Renal insufficiency 09/18/2016  . Kidney disease 09/18/2016  . Intractable vomiting     Past Surgical History:  Procedure Laterality Date  . INCISION AND DRAINAGE BREAST ABSCESS      OB History    No data available       Home Medications    Prior to Admission medications   Medication Sig Start Date End Date Taking? Authorizing Provider  acetaminophen (TYLENOL) 325 MG tablet Take 650 mg by mouth every 6 (six) hours as needed.    [provider]  famotidine (PEPCID) 20 MG tablet Take 1 tablet (20 mg total) by mouth at bedtime. 09/24/16   Albertine GratesXu, Fang,  MD  HYDROcodone-acetaminophen (NORCO/VICODIN) 5-325 MG tablet Take 1-2 tablets by mouth every 4 (four) hours as needed for moderate pain or severe pain. 11/23/16   Shaune PollackIsaacs, Masey Scheiber, MD  insulin aspart (NOVOLOG) 100 UNIT/ML injection Inject 25 Units into the skin 3 (three) times daily before meals.     [provider]  insulin detemir (LEVEMIR) 100 UNIT/ML injection Inject 25 Units into the skin at bedtime.     [provider]  lisinopril (PRINIVIL,ZESTRIL) 5 MG tablet Take 1 tablet (5 mg total) by mouth daily. 09/24/16   Albertine GratesXu, Fang, MD  loratadine (CLARITIN) 10 MG tablet Take 1 tablet (10 mg total) by mouth daily. 11/23/16 12/03/16  Shaune PollackIsaacs, Nery Frappier, MD  metoCLOPramide (REGLAN) 5 MG tablet Take 1 tablet (5 mg total) by mouth every 6 (six) hours as needed for nausea. 09/24/16   Albertine GratesXu, Fang, MD  ondansetron (ZOFRAN ODT) 8 MG disintegrating tablet Take 1 tablet (8 mg total) by mouth every 8 (eight) hours as needed for nausea or vomiting. Patient not taking: Reported on 09/18/2016 05/13/16   Molpus, John, MD  pantoprazole (PROTONIX) 40 MG tablet Take 40 mg by mouth daily.    [provider]  potassium chloride SA (K-DUR,KLOR-CON) 20 MEQ tablet Take 2 tablets (40 mEq total) by mouth daily. 09/24/16   Albertine GratesXu, Fang, MD    Family History History reviewed. No pertinent family history.  Social History Social History  Substance Use Topics  . Smoking status: Never  Smoker  . Smokeless tobacco: Never Used  . Alcohol use No     Allergies   Patient has no known allergies.   Review of Systems Review of Systems  Constitutional: Positive for chills and fatigue.  Gastrointestinal: Positive for nausea and vomiting.  Endocrine: Positive for polydipsia.  Neurological: Positive for dizziness and light-headedness.  All other systems reviewed and are negative.    Physical Exam Updated Vital Signs BP (!) 95/56   Pulse (!) 104   Temp 97.7 F (36.5 C) (Oral)   Resp 12   Wt 55.3 kg (122 lb)    SpO2 99%   BMI 21.96 kg/m   Physical Exam  Constitutional: She is oriented to person, place, and time. She appears well-developed and well-nourished. No distress.  HENT:  Head: Normocephalic and atraumatic.  Dry MM  Eyes: Conjunctivae are normal.  Neck: Neck supple.  Cardiovascular: Regular rhythm and normal heart sounds.  Tachycardia present.  Exam reveals no friction rub.   No murmur heard. Pulmonary/Chest: Effort normal and breath sounds normal. No tachypnea. No respiratory distress. She has no wheezes. She has no rales.  Abdominal: She exhibits no distension.  Musculoskeletal: She exhibits no edema.  Neurological: She is alert and oriented to person, place, and time. She exhibits normal muscle tone.  Skin: Skin is warm. Capillary refill takes less than 2 seconds.  Psychiatric: She has a normal mood and affect.  Nursing note and vitals reviewed.    ED Treatments / Results  Labs (all labs ordered are listed, but only abnormal results are displayed) Labs Reviewed  CBC WITH DIFFERENTIAL/PLATELET - Abnormal; Notable for the following:       Result Value   WBC 12.8 (*)    RDW 16.0 (*)    Neutro Abs 9.7 (*)    All other components within normal limits  COMPREHENSIVE METABOLIC PANEL - Abnormal; Notable for the following:    Sodium 134 (*)    Chloride 96 (*)    CO2 15 (*)    Glucose, Bld 414 (*)    BUN 33 (*)    Creatinine, Ser 2.13 (*)    Total Protein 8.8 (*)    GFR calc non Af Amer 31 (*)    GFR calc Af Amer 36 (*)    Anion gap 23 (*)    All other components within normal limits  CBG MONITORING, ED - Abnormal; Notable for the following:    Glucose-Capillary 347 (*)    All other components within normal limits  I-STAT CG4 LACTIC ACID, ED - Abnormal; Notable for the following:    Lactic Acid, Venous 4.25 (*)    All other components within normal limits  CULTURE, BLOOD (ROUTINE X 2)  CULTURE, BLOOD (ROUTINE X 2)  URINE CULTURE  LIPASE, BLOOD  LACTIC ACID, PLASMA    LACTIC ACID, PLASMA  URINALYSIS, ROUTINE W REFLEX MICROSCOPIC  PREGNANCY, URINE  I-STAT VENOUS BLOOD GAS, ED  CBG MONITORING, ED    EKG  EKG Interpretation  Date/Time:  Saturday January 08 2017 00:19:24 EDT Ventricular Rate:  115 PR Interval:    QRS Duration: 73 QT Interval:  387 QTC Calculation: 536 R Axis:   67 Text Interpretation:  Sinus tachycardia Borderline T abnormalities, anterior leads Prolonged QT interval Baseline wander in lead(s) V6 No significant change since last tracing Confirmed by Shaune Pollack [phone]) on 01/08/2017 12:31:29 AM       Radiology Dg Chest 2 View  Result Date: 01/08/2017 CLINICAL DATA:  Acute onset  of generalized weakness and lethargy. Initial encounter. EXAM: CHEST  2 VIEW COMPARISON:  Chest radiograph performed 09/18/2016 FINDINGS: The lungs are well-aerated and clear. There is no evidence of focal opacification, pleural effusion or pneumothorax. The heart is normal in size; the mediastinal contour is within normal limits. No acute osseous abnormalities are seen. Bilateral metallic nipple piercings are noted. IMPRESSION: No acute cardiopulmonary process seen. Electronically Signed   By: Roanna Raider M.D.   On: 01/08/2017 02:29    Procedures .Critical Care Performed by: Shaune Pollack Authorized by: Shaune Pollack      (including critical care time)  CRITICAL CARE Performed by: Dollene Cleveland   Total critical care time: 45 minutes  Critical care time was exclusive of separately billable procedures and treating other patients.  Critical care was necessary to treat or prevent imminent or life-threatening deterioration.  Critical care was time spent personally by me on the following activities: development of treatment plan with patient and/or surrogate as well as nursing, discussions with consultants, evaluation of patient's response to treatment, examination of patient, obtaining history from patient or surrogate, ordering and performing  treatments and interventions, ordering and review of laboratory studies, ordering and review of radiographic studies, pulse oximetry and re-evaluation of patient's condition.     Emergency Ultrasound Study:   Angiocath insertion Performed by: Dollene Cleveland Consent: Verbal consent/emergent consent obtained. Risks and benefits: risks, benefits and alternatives were discussed Immediately prior to procedure the correct patient, procedure, equipment, support staff and site/side marked as needed.  Indication: difficult IV access Preparation: Patient was prepped and draped in the usual sterile fashion. Sterile gel was used for this procedure and the ultrasound probe was sterilized prior to use. Vein Location: Right forearm vein was visualized during assessment for potential access sites and was found to be patent/ easily compressed with linear ultrasound.  The needle was visualized with real-time ultrasound and guided into the vein. Gauge: 20  Image saved and stored.  Normal blood return.   Patient tolerance: Patient tolerated the procedure well with no immediate complications.       Medications Ordered in ED Medications  insulin regular (NOVOLIN R,HUMULIN R) 100 Units in sodium chloride 0.9 % 100 mL (1 Units/mL) infusion (3.5 Units/hr Intravenous New Bag/Given 01/08/17 0218)  dextrose 5 %-0.45 % sodium chloride infusion (not administered)  insulin regular (NOVOLIN R,HUMULIN R) 100 units/mL injection (not administered)  sodium chloride 0.9 % bolus 1,000 mL (0 mLs Intravenous Stopped 01/08/17 0146)  sodium chloride 0.9 % bolus 1,000 mL (0 mLs Intravenous Stopped 01/08/17 0112)  piperacillin-tazobactam (ZOSYN) IVPB 3.375 g (0 g Intravenous Stopped 01/08/17 0127)  vancomycin (VANCOCIN) IVPB 1000 mg/200 mL premix (1,000 mg Intravenous New Bag/Given 01/08/17 0147)  0.9 % NaCl with KCl 20 mEq/ L  infusion ( Intravenous New Bag/Given 01/08/17 0145)  potassium chloride SA (K-DUR,KLOR-CON) CR tablet 20 mEq  (20 mEq Oral Given 01/08/17 0149)     Initial Impression / Assessment and Plan / ED Course  I have reviewed the triage vital signs and the nursing notes.  Pertinent labs & imaging results that were available during my care of the patient were reviewed by me and considered in my medical decision making (see chart for details).     26 yo F with h/o poorly controlled T1DM here with nausea, fatigue, and b/l leg cramping. Pt hypotensive, tachycardic on arrival. She appears ill but is mentating appropriately. Lab work is c/w recurrent DKA with CO2 15, AG 23, Glu>400. Pt also has  acute, likely pre-renal, AKI with Cr 2 (baseline <1). Pt given 2L NS, insulin gtt, and mIVF. LA also returned as >4 so empiric ABX started, though suspect some of this could be 2/2 her profound dehydration and DKA. Feel it's reasonable to cover empirically at this time given her leukocytosis, hypotension, +SIRS criteria. Will admit to Step Down. She is o/w mentating well at this time, protecting airway.  Final Clinical Impressions(s) / ED Diagnoses   Final diagnoses:  SIRS (systemic inflammatory response syndrome) (HCC)  Lactic acidosis  Type 1 diabetes mellitus with ketoacidosis without coma (HCC)    New Prescriptions New Prescriptions   No medications on file     Shaune Pollack, MD 01/08/17 [phone]

## 2017-01-08 NOTE — ED Notes (Addendum)
Patient transported to X-ray 

## 2017-01-09 LAB — URINE CULTURE: Culture: NO GROWTH

## 2017-01-09 LAB — HEMOGLOBIN A1C
Hgb A1c MFr Bld: 11.3 % — ABNORMAL HIGH (ref 4.8–5.6)
MEAN PLASMA GLUCOSE: 278 mg/dL

## 2017-01-13 LAB — CULTURE, BLOOD (ROUTINE X 2)
Culture: NO GROWTH
SPECIAL REQUESTS: ADEQUATE

## 2018-01-08 ENCOUNTER — Encounter (HOSPITAL_BASED_OUTPATIENT_CLINIC_OR_DEPARTMENT_OTHER): Payer: Self-pay | Admitting: *Deleted

## 2018-01-08 ENCOUNTER — Other Ambulatory Visit: Payer: Self-pay

## 2018-01-08 DIAGNOSIS — E119 Type 2 diabetes mellitus without complications: Secondary | ICD-10-CM | POA: Insufficient documentation

## 2018-01-08 DIAGNOSIS — M26603 Bilateral temporomandibular joint disorder, unspecified: Secondary | ICD-10-CM | POA: Insufficient documentation

## 2018-01-08 DIAGNOSIS — I10 Essential (primary) hypertension: Secondary | ICD-10-CM | POA: Insufficient documentation

## 2018-01-08 DIAGNOSIS — Z794 Long term (current) use of insulin: Secondary | ICD-10-CM | POA: Insufficient documentation

## 2018-01-08 DIAGNOSIS — Z79899 Other long term (current) drug therapy: Secondary | ICD-10-CM | POA: Insufficient documentation

## 2018-01-08 NOTE — ED Triage Notes (Signed)
Pt c/o bilateral ear pain x 2 weeks, left worse than right

## 2018-01-09 ENCOUNTER — Emergency Department (HOSPITAL_BASED_OUTPATIENT_CLINIC_OR_DEPARTMENT_OTHER)
Admission: EM | Admit: 2018-01-09 | Discharge: 2018-01-09 | Disposition: A | Discharge status: Home / Self Care | Payer: Self-pay | Attending: Emergency Medicine | Admitting: Emergency Medicine

## 2018-01-09 DIAGNOSIS — M26609 Unspecified temporomandibular joint disorder, unspecified side: Secondary | ICD-10-CM

## 2018-01-09 MED ORDER — DIAZEPAM 5 MG PO TABS
5.0000 mg | ORAL_TABLET | Freq: Three times a day (TID) | ORAL | 0 refills | Status: AC | PRN
Start: 2018-01-09 — End: ?

## 2018-01-09 MED ORDER — DIAZEPAM 5 MG PO TABS
5.0000 mg | ORAL_TABLET | Freq: Once | ORAL | Status: AC
  Administered 2018-01-09: 5 mg via ORAL
  Filled 2018-01-09: qty 1

## 2018-01-09 MED FILL — diazePAM 5 MG TABS: 5 | 6 days supply | Qty: 20 | Fill #0

## 2018-01-09 NOTE — ED Provider Notes (Signed)
[facility] Provider Note: Tricia Dell, MD, FACEP  CSN: [id number] MRN: [id number] ARRIVAL: 01/08/18 at 2331 ROOM: MH05/[facility]05   CHIEF COMPLAINT  Ear Pain   HISTORY OF PRESENT ILLNESS  01/09/18 2:49 AM Tricia Boone is a 27 y.o. female who complains of bilateral ear pain for the past 2 weeks.  The pain is worse on the right and is moderate to severe at times.  It is exacerbated by chewing.  She has had no drainage from her ears.   Past Medical History:  Diagnosis Date  . Diabetes mellitus without complication (HCC)   . GERD (gastroesophageal reflux disease)   . High cholesterol   . Hypertension     Past Surgical History:  Procedure Laterality Date  . INCISION AND DRAINAGE BREAST ABSCESS      Family History  Problem Relation Age of Onset  . Hypertension Other     Social History   Tobacco Use  . Smoking status: Never Smoker  . Smokeless tobacco: Never Used  Substance Use Topics  . Alcohol use: No  . Drug use: No    Prior to Admission medications   Medication Sig Start Date End Date Taking? Authorizing Provider  gabapentin (NEURONTIN) 300 MG capsule Take 300 mg by mouth 3 (three) times daily.   Yes [provider]  insulin aspart (NOVOLOG) 100 UNIT/ML injection Inject 25 Units into the skin 3 (three) times daily before meals.    Yes [provider]  insulin detemir (LEVEMIR) 100 UNIT/ML injection Inject 0.3 mLs (30 Units total) into the skin 2 (two) times daily. 01/08/17  Yes Tricia Stain, DO  loratadine (CLARITIN) 10 MG tablet Take 1 tablet (10 mg total) by mouth daily. 11/23/16 01/08/18 Yes Tricia Pollack, MD  metoCLOPramide (REGLAN) 5 MG tablet Take 1 tablet (5 mg total) by mouth every 6 (six) hours as needed for nausea. 09/24/16  Yes Tricia Grates, MD  ondansetron (ZOFRAN ODT) 8 MG disintegrating tablet Take 1 tablet (8 mg total) by mouth every 8 (eight) hours as needed for nausea or vomiting. 05/13/16  Yes Tricia Dlugosz, MD    pantoprazole (PROTONIX) 40 MG tablet Take 40 mg by mouth daily.   Yes [provider]  HYDROcodone-acetaminophen (NORCO/VICODIN) 5-325 MG tablet Take 1-2 tablets by mouth every 4 (four) hours as needed for moderate pain or severe pain. 11/23/16   Tricia Pollack, MD    Allergies Lisinopril and Oxycodone   REVIEW OF SYSTEMS  Negative except as noted here or in the History of Present Illness.   PHYSICAL EXAMINATION  Initial Vital Signs Blood pressure 122/84, pulse (!) 114, temperature 98.5 F (36.9 C), temperature source Oral, resp. rate 20, height 5' 2.5" (1.588 m), weight 58.1 kg (128 lb), last menstrual period 12/14/2017, SpO2 100 %.  Examination General: Well-developed, well-nourished female in no acute distress; appearance consistent with age of record HENT: normocephalic; atraumatic; TMs normal; tender bilateral TMJs with pain on movement of jaw Eyes: Normal appearance Neck: supple Heart: regular rate and rhythm Lungs: clear to auscultation bilaterally Abdomen: soft; nondistended; nontender; bowel sounds present Extremities: No deformity; full range of motion Neurologic: Awake, alert and oriented; motor function intact in all extremities and symmetric; no facial droop Skin: Warm and dry Psychiatric: Normal mood and affect   RESULTS  Summary of this visit's results, reviewed by myself:   EKG Interpretation  Date/Time:    Ventricular Rate:    PR Interval:    QRS Duration:   QT Interval:  QTC Calculation:   R Axis:     Text Interpretation:        Laboratory Studies: No results found for this or any previous visit (from the past 24 hour(s)). Imaging Studies: No results found.  ED COURSE and MDM  Nursing notes and initial vitals signs, including pulse oximetry, reviewed.  Vitals:   01/08/18 2340 01/08/18 2342 01/09/18 0156  BP: (!) 137/100  122/84  Pulse: (!) 111  (!) 114  Resp: 18  20  Temp: 98.3 F (36.8 C)  98.5 F (36.9 C)  TempSrc: Oral   Oral  SpO2: 100%  100%  Weight:  58.1 kg (128 lb)   Height:  5' 2.5" (1.588 m)    History and exam suspicious for TMJ dysfunction.  The patient does have a dentist with whom she can follow-up, and she was advised to do so.  PROCEDURES    ED DIAGNOSES     ICD-10-CM   1. 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

## 2018-08-16 ENCOUNTER — Other Ambulatory Visit: Payer: Self-pay

## 2018-08-16 ENCOUNTER — Encounter (HOSPITAL_COMMUNITY): Payer: Self-pay | Admitting: Emergency Medicine

## 2018-08-16 ENCOUNTER — Observation Stay (HOSPITAL_COMMUNITY)
Admission: EM | Admit: 2018-08-16 | Discharge: 2018-08-18 | Disposition: A | Discharge status: Home / Self Care | Payer: PRIVATE HEALTH INSURANCE | Attending: Internal Medicine | Admitting: Internal Medicine

## 2018-08-16 DIAGNOSIS — Z794 Long term (current) use of insulin: Secondary | ICD-10-CM | POA: Diagnosis not present

## 2018-08-16 DIAGNOSIS — R9431 Abnormal electrocardiogram [ECG] [EKG]: Secondary | ICD-10-CM | POA: Diagnosis not present

## 2018-08-16 DIAGNOSIS — E1043 Type 1 diabetes mellitus with diabetic autonomic (poly)neuropathy: Secondary | ICD-10-CM | POA: Diagnosis not present

## 2018-08-16 DIAGNOSIS — Z8249 Family history of ischemic heart disease and other diseases of the circulatory system: Secondary | ICD-10-CM | POA: Diagnosis not present

## 2018-08-16 DIAGNOSIS — N179 Acute kidney failure, unspecified: Secondary | ICD-10-CM | POA: Diagnosis present

## 2018-08-16 DIAGNOSIS — G6289 Other specified polyneuropathies: Secondary | ICD-10-CM | POA: Diagnosis not present

## 2018-08-16 DIAGNOSIS — K219 Gastro-esophageal reflux disease without esophagitis: Secondary | ICD-10-CM | POA: Diagnosis not present

## 2018-08-16 DIAGNOSIS — E785 Hyperlipidemia, unspecified: Secondary | ICD-10-CM | POA: Diagnosis not present

## 2018-08-16 DIAGNOSIS — R112 Nausea with vomiting, unspecified: Secondary | ICD-10-CM | POA: Diagnosis present

## 2018-08-16 DIAGNOSIS — E7800 Pure hypercholesterolemia, unspecified: Secondary | ICD-10-CM | POA: Diagnosis not present

## 2018-08-16 DIAGNOSIS — K3184 Gastroparesis: Secondary | ICD-10-CM | POA: Diagnosis present

## 2018-08-16 DIAGNOSIS — E1042 Type 1 diabetes mellitus with diabetic polyneuropathy: Secondary | ICD-10-CM | POA: Diagnosis not present

## 2018-08-16 DIAGNOSIS — I10 Essential (primary) hypertension: Secondary | ICD-10-CM | POA: Insufficient documentation

## 2018-08-16 DIAGNOSIS — Z79899 Other long term (current) drug therapy: Secondary | ICD-10-CM | POA: Insufficient documentation

## 2018-08-16 DIAGNOSIS — E1010 Type 1 diabetes mellitus with ketoacidosis without coma: Secondary | ICD-10-CM | POA: Diagnosis not present

## 2018-08-16 DIAGNOSIS — E1143 Type 2 diabetes mellitus with diabetic autonomic (poly)neuropathy: Secondary | ICD-10-CM | POA: Diagnosis not present

## 2018-08-16 DIAGNOSIS — E109 Type 1 diabetes mellitus without complications: Secondary | ICD-10-CM | POA: Diagnosis present

## 2018-08-16 DIAGNOSIS — D72829 Elevated white blood cell count, unspecified: Secondary | ICD-10-CM | POA: Insufficient documentation

## 2018-08-16 DIAGNOSIS — N289 Disorder of kidney and ureter, unspecified: Secondary | ICD-10-CM

## 2018-08-16 LAB — COMPREHENSIVE METABOLIC PANEL
ALBUMIN: 3.9 g/dL (ref 3.5–5.0)
ALT: 96 U/L — ABNORMAL HIGH (ref 0–44)
ANION GAP: 24 — AB (ref 5–15)
AST: 58 U/L — ABNORMAL HIGH (ref 15–41)
Alkaline Phosphatase: 93 U/L (ref 38–126)
BUN: 28 mg/dL — ABNORMAL HIGH (ref 6–20)
CHLORIDE: 100 mmol/L (ref 98–111)
CO2: 16 mmol/L — AB (ref 22–32)
Calcium: 9.7 mg/dL (ref 8.9–10.3)
Creatinine, Ser: 1.91 mg/dL — ABNORMAL HIGH (ref 0.44–1.00)
GFR calc Af Amer: 40 mL/min — ABNORMAL LOW (ref >60)
GFR calc non Af Amer: 35 mL/min — ABNORMAL LOW (ref >60)
GLUCOSE: 305 mg/dL — AB (ref 70–99)
POTASSIUM: 4.1 mmol/L (ref 3.5–5.1)
Sodium: 140 mmol/L (ref 135–145)
Total Bilirubin: 1.6 mg/dL — ABNORMAL HIGH (ref 0.3–1.2)
Total Protein: 8 g/dL (ref 6.5–8.1)

## 2018-08-16 LAB — CBC WITH DIFFERENTIAL/PLATELET
ABS IMMATURE GRANULOCYTES: 0.11 10*3/uL — AB (ref 0.00–0.07)
BASOS ABS: 0.1 10*3/uL (ref 0.0–0.1)
Basophils Relative: 0 %
Eosinophils Absolute: 0 10*3/uL (ref 0.0–0.5)
Eosinophils Relative: 0 %
HEMATOCRIT: 40.3 % (ref 36.0–46.0)
HEMOGLOBIN: 13.3 g/dL (ref 12.0–15.0)
IMMATURE GRANULOCYTES: 1 %
LYMPHS PCT: 14 %
Lymphs Abs: 2.2 10*3/uL (ref 0.7–4.0)
MCH: 29 pg (ref 26.0–34.0)
MCHC: 33 g/dL (ref 30.0–36.0)
MCV: 87.8 fL (ref 80.0–100.0)
Monocytes Absolute: 0.6 10*3/uL (ref 0.1–1.0)
Monocytes Relative: 4 %
NEUTROS ABS: 12.7 10*3/uL — AB (ref 1.7–7.7)
NEUTROS PCT: 81 %
Platelets: 407 10*3/uL — ABNORMAL HIGH (ref 150–400)
RBC: 4.59 MIL/uL (ref 3.87–5.11)
RDW: 13.6 % (ref 11.5–15.5)
WBC: 15.7 10*3/uL — ABNORMAL HIGH (ref 4.0–10.5)
nRBC: 0 % (ref 0.0–0.2)

## 2018-08-16 LAB — POCT I-STAT 7, (LYTES, BLD GAS, ICA,H+H)
Acid-base deficit: 7 mmol/L — ABNORMAL HIGH (ref 0.0–2.0)
Bicarbonate: 18.3 mmol/L — ABNORMAL LOW (ref 20.0–28.0)
Calcium, Ion: 1.14 mmol/L — ABNORMAL LOW (ref 1.15–1.40)
HCT: 28 % — ABNORMAL LOW (ref 36.0–46.0)
Hemoglobin: 9.5 g/dL — ABNORMAL LOW (ref 12.0–15.0)
O2 Saturation: 96 %
PCO2 ART: 35.8 mmHg (ref 32.0–48.0)
Patient temperature: 98.9
Potassium: 4 mmol/L (ref 3.5–5.1)
Sodium: 142 mmol/L (ref 135–145)
TCO2: 19 mmol/L — ABNORMAL LOW (ref 22–32)
pH, Arterial: 7.318 — ABNORMAL LOW (ref 7.350–7.450)
pO2, Arterial: 90 mmHg (ref 83.0–108.0)

## 2018-08-16 LAB — CBG MONITORING, ED
GLUCOSE-CAPILLARY: 268 mg/dL — AB (ref 70–99)
Glucose-Capillary: 188 mg/dL — ABNORMAL HIGH (ref 70–99)
Glucose-Capillary: 154 mg/dL — ABNORMAL HIGH (ref 70–99)
Glucose-Capillary: 130 mg/dL — ABNORMAL HIGH (ref 70–99)

## 2018-08-16 LAB — I-STAT BETA HCG BLOOD, ED (MC, WL, AP ONLY): I-stat hCG, quantitative: <5 m[IU]/mL (ref <5)

## 2018-08-16 LAB — BASIC METABOLIC PANEL
ANION GAP: 16 — AB (ref 5–15)
BUN: 25 mg/dL — ABNORMAL HIGH (ref 6–20)
CALCIUM: 8.5 mg/dL — AB (ref 8.9–10.3)
CO2: 17 mmol/L — ABNORMAL LOW (ref 22–32)
Chloride: 111 mmol/L (ref 98–111)
Creatinine, Ser: 1.33 mg/dL — ABNORMAL HIGH (ref 0.44–1.00)
GFR calc Af Amer: >60 mL/min (ref >60)
GFR calc non Af Amer: 54 mL/min — ABNORMAL LOW (ref >60)
Glucose, Bld: 181 mg/dL — ABNORMAL HIGH (ref 70–99)
POTASSIUM: 3.9 mmol/L (ref 3.5–5.1)
Sodium: 144 mmol/L (ref 135–145)

## 2018-08-16 LAB — CBC
HCT: 35.2 % — ABNORMAL LOW (ref 36.0–46.0)
Hemoglobin: 11.2 g/dL — ABNORMAL LOW (ref 12.0–15.0)
MCH: 27.5 pg (ref 26.0–34.0)
MCHC: 31.8 g/dL (ref 30.0–36.0)
MCV: 86.5 fL (ref 80.0–100.0)
Platelets: 341 10*3/uL (ref 150–400)
RBC: 4.07 MIL/uL (ref 3.87–5.11)
RDW: 13.4 % (ref 11.5–15.5)
WBC: 14.8 10*3/uL — ABNORMAL HIGH (ref 4.0–10.5)
nRBC: 0 % (ref 0.0–0.2)

## 2018-08-16 LAB — CREATININE, SERUM
Creatinine, Ser: 1.39 mg/dL — ABNORMAL HIGH (ref 0.44–1.00)
GFR calc non Af Amer: 51 mL/min — ABNORMAL LOW (ref >60)
GFR, EST AFRICAN AMERICAN: 59 mL/min — AB (ref >60)

## 2018-08-16 LAB — I-STAT TROPONIN, ED: TROPONIN I, POC: 0 ng/mL (ref 0.00–0.08)

## 2018-08-16 LAB — BETA-HYDROXYBUTYRIC ACID: BETA-HYDROXYBUTYRIC ACID: 2.56 mmol/L — AB (ref 0.05–0.27)

## 2018-08-16 LAB — PHOSPHORUS: Phosphorus: 2.8 mg/dL (ref 2.5–4.6)

## 2018-08-16 LAB — MAGNESIUM: Magnesium: 2.4 mg/dL (ref 1.7–2.4)

## 2018-08-16 LAB — MRSA PCR SCREENING: MRSA by PCR: NEGATIVE

## 2018-08-16 LAB — GLUCOSE, CAPILLARY
Glucose-Capillary: 147 mg/dL — ABNORMAL HIGH (ref 70–99)
Glucose-Capillary: 191 mg/dL — ABNORMAL HIGH (ref 70–99)
Glucose-Capillary: 192 mg/dL — ABNORMAL HIGH (ref 70–99)
Glucose-Capillary: 240 mg/dL — ABNORMAL HIGH (ref 70–99)

## 2018-08-16 MED ORDER — KETOROLAC TROMETHAMINE 15 MG/ML IJ SOLN: 15.0000 mg | Freq: Four times a day (QID) | INTRAMUSCULAR | Status: DC | PRN

## 2018-08-16 MED ORDER — INSULIN REGULAR(HUMAN) IN NACL 100-0.9 UT/100ML-% IV SOLN: INTRAVENOUS | Status: DC

## 2018-08-16 MED ORDER — SODIUM CHLORIDE 0.9 % IV SOLN
Freq: Once | INTRAVENOUS | Status: AC
  Administered 2018-08-16: 18:00:00 via INTRAVENOUS

## 2018-08-16 MED ORDER — ONDANSETRON HCL 4 MG PO TABS: 4.0000 mg | ORAL_TABLET | Freq: Three times a day (TID) | ORAL | Status: DC | PRN

## 2018-08-16 MED ORDER — FAMOTIDINE IN NACL 20-0.9 MG/50ML-% IV SOLN
20.0000 mg | Freq: Once | INTRAVENOUS | Status: AC
  Administered 2018-08-16: 20 mg via INTRAVENOUS
  Filled 2018-08-16: qty 50

## 2018-08-16 MED ORDER — METOCLOPRAMIDE HCL 5 MG/ML IJ SOLN
5.0000 mg | Freq: Once | INTRAMUSCULAR | Status: AC
  Administered 2018-08-16: 5 mg via INTRAVENOUS
  Filled 2018-08-16: qty 2

## 2018-08-16 MED ORDER — SENNOSIDES-DOCUSATE SODIUM 8.6-50 MG PO TABS: 1.0000 | ORAL_TABLET | Freq: Every evening | ORAL | Status: DC | PRN

## 2018-08-16 MED ORDER — ACETAMINOPHEN 325 MG PO TABS: 650.0000 mg | ORAL_TABLET | Freq: Four times a day (QID) | ORAL | Status: DC | PRN

## 2018-08-16 MED ORDER — SODIUM CHLORIDE 0.9 % IV BOLUS
1000.0000 mL | Freq: Once | INTRAVENOUS | Status: AC
  Administered 2018-08-16: 1000 mL via INTRAVENOUS

## 2018-08-16 MED ORDER — DEXTROSE-NACL 5-0.45 % IV SOLN
INTRAVENOUS | Status: DC
  Administered 2018-08-17: 14:00:00 via INTRAVENOUS

## 2018-08-16 MED ORDER — METOCLOPRAMIDE HCL 10 MG PO TABS: 5.0000 mg | ORAL_TABLET | Freq: Four times a day (QID) | ORAL | Status: DC | PRN

## 2018-08-16 MED ORDER — ACETAMINOPHEN 650 MG RE SUPP: 650.0000 mg | Freq: Four times a day (QID) | RECTAL | Status: DC | PRN

## 2018-08-16 MED ORDER — ONDANSETRON HCL 4 MG/2ML IJ SOLN
4.0000 mg | Freq: Once | INTRAMUSCULAR | Status: AC
  Administered 2018-08-16: 4 mg via INTRAVENOUS
  Filled 2018-08-16: qty 2

## 2018-08-16 MED ORDER — DICYCLOMINE HCL 10 MG/ML IM SOLN
20.0000 mg | Freq: Once | INTRAMUSCULAR | Status: AC
Start: 2018-08-16 — End: 2018-08-16
  Administered 2018-08-16: 20 mg via INTRAMUSCULAR
  Filled 2018-08-16: qty 2

## 2018-08-16 MED ORDER — DEXTROSE-NACL 5-0.45 % IV SOLN
INTRAVENOUS | Status: DC
  Administered 2018-08-16: 18:00:00 via INTRAVENOUS

## 2018-08-16 MED ORDER — POTASSIUM CHLORIDE 10 MEQ/100ML IV SOLN: 10.0000 meq | INTRAVENOUS | Status: AC

## 2018-08-16 MED ORDER — BISACODYL 5 MG PO TBEC: 5.0000 mg | DELAYED_RELEASE_TABLET | Freq: Every day | ORAL | Status: DC | PRN

## 2018-08-16 MED ORDER — DIPHENHYDRAMINE HCL 50 MG/ML IJ SOLN
25.0000 mg | Freq: Once | INTRAMUSCULAR | Status: AC
  Administered 2018-08-16: 25 mg via INTRAVENOUS
  Filled 2018-08-16: qty 1

## 2018-08-16 MED ORDER — GABAPENTIN 300 MG PO CAPS
300.0000 mg | ORAL_CAPSULE | Freq: Three times a day (TID) | ORAL | Status: DC
  Administered 2018-08-16 – 2018-08-18 (×5): 300 mg via ORAL
  Filled 2018-08-16 (×5): qty 1

## 2018-08-16 MED ORDER — POTASSIUM CHLORIDE CRYS ER 20 MEQ PO TBCR
20.0000 meq | EXTENDED_RELEASE_TABLET | Freq: Once | ORAL | Status: AC
  Administered 2018-08-16: 20 meq via ORAL
  Filled 2018-08-16: qty 1

## 2018-08-16 MED ORDER — POTASSIUM CHLORIDE 10 MEQ/100ML IV SOLN
10.0000 meq | INTRAVENOUS | Status: DC
  Administered 2018-08-16: 10 meq via INTRAVENOUS
  Filled 2018-08-16: qty 100

## 2018-08-16 MED ORDER — POTASSIUM CHLORIDE 10 MEQ/100ML IV SOLN: 10.0000 meq | INTRAVENOUS | Status: DC

## 2018-08-16 MED ORDER — HYDROCODONE-ACETAMINOPHEN 5-325 MG PO TABS: 1.0000 | ORAL_TABLET | ORAL | Status: DC | PRN

## 2018-08-16 MED ORDER — SODIUM CHLORIDE 0.9 % IV SOLN: INTRAVENOUS | Status: DC

## 2018-08-16 MED ORDER — HEPARIN SODIUM (PORCINE) 5000 UNIT/ML IJ SOLN
5000.0000 [IU] | Freq: Three times a day (TID) | INTRAMUSCULAR | Status: DC
  Filled 2018-08-16: qty 1

## 2018-08-16 MED ORDER — INSULIN REGULAR(HUMAN) IN NACL 100-0.9 UT/100ML-% IV SOLN
INTRAVENOUS | Status: DC
  Administered 2018-08-16: 1.3 [IU]/h via INTRAVENOUS
  Filled 2018-08-16: qty 100

## 2018-08-16 MED ORDER — PANTOPRAZOLE SODIUM 40 MG PO TBEC
40.0000 mg | DELAYED_RELEASE_TABLET | Freq: Every day | ORAL | Status: DC
  Administered 2018-08-16 – 2018-08-17 (×2): 40 mg via ORAL
  Filled 2018-08-16 (×2): qty 1

## 2018-08-16 NOTE — ED Notes (Signed)
CBG collected. Result "130." RN, Ben, notified.

## 2018-08-16 NOTE — ED Provider Notes (Signed)
[facility] EMERGENCY DEPARTMENT Provider Note   CSN: [id number] Arrival date & time: 08/16/18  1436     History   Chief Complaint Chief Complaint  Patient presents with  . Hyperglycemia  . Emesis    HPI Tricia Boone is a 28 y.o. female.  Tricia Boone is a 28 y.o. female history of diabetes with multiple episodes of DKA, gastroparesis, GERD, hypertension, hyperlipidemia and renal insufficiency, who presents to the emergency department via EMS for evaluation of several episodes of nausea and vomiting and a near syncopal episode.  Patient reports she did not fall to the ground or hit her head and this was witnessed by employees at work.  Patient reports that she has had several episodes of vomiting and was dry heaving on EMS arrival and continues to feel nauseated reports that this feels very similar to her gastroparesis and how she usually feels with the flareup.  She is also had multiple episodes of DKA but reports that her glucose was 130 this morning, she has been taking her insulin regularly, took her long-acting insulin last night and her short acting this morning as per usual, glucose was reported to be in the 300s with EMS.  She reports that she has generalized abdominal pain but no focal pain.  Denies fevers or chills, no chest pain, shortness of breath or cough, no dysuria or urinary frequency.  She has not taken any antiemetics or other medications to control her symptoms prior to arrival.  Was recently admitted at the end of January to [facility] for an exacerbation of her gastroparesis without DKA.     Past Medical History:  Diagnosis Date  . Diabetes mellitus without complication (HCC)   . GERD (gastroesophageal reflux disease)   . High cholesterol   . Hypertension     Patient Active Problem List   Diagnosis Date Noted  . ARF (acute renal failure) (HCC) 01/08/2017  . DKA (diabetic ketoacidoses) (HCC) 09/18/2016  . Metabolic  acidosis 09/18/2016  . Acute encephalopathy 09/18/2016  . Type 1 diabetes mellitus (HCC) 09/18/2016  . SIRS (systemic inflammatory response syndrome) (HCC) 09/18/2016  . Renal insufficiency 09/18/2016  . Kidney disease 09/18/2016  . Intractable vomiting     Past Surgical History:  Procedure Laterality Date  . INCISION AND DRAINAGE BREAST ABSCESS       OB History   No obstetric history on file.      Home Medications    Prior to Admission medications   Medication Sig Start Date End Date Taking? Authorizing Provider  diazepam (VALIUM) 5 MG tablet Take 1 tablet (5 mg total) by mouth every 8 (eight) hours as needed (TMJ spasms; may cause drowsiness). 01/09/18   Molpus, John, MD  gabapentin (NEURONTIN) 300 MG capsule Take 300 mg by mouth 3 (three) times daily.    [provider]  insulin aspart (NOVOLOG) 100 UNIT/ML injection Inject 25 Units into the skin 3 (three) times daily before meals.     [provider]  insulin detemir (LEVEMIR) 100 UNIT/ML injection Inject 0.3 mLs (30 Units total) into the skin 2 (two) times daily. 01/08/17   Noralee Stain, DO  loratadine (CLARITIN) 10 MG tablet Take 1 tablet (10 mg total) by mouth daily. 11/23/16 01/08/18  Shaune Pollack, MD  metoCLOPramide (REGLAN) 5 MG tablet Take 1 tablet (5 mg total) by mouth every 6 (six) hours as needed for nausea. 09/24/16   Albertine Grates, MD  pantoprazole (PROTONIX) 40 MG tablet Take  40 mg by mouth daily.    [provider]    Family History Family History  Problem Relation Age of Onset  . Hypertension Other     Social History Social History   Tobacco Use  . Smoking status: Never Smoker  . Smokeless tobacco: Never Used  Substance Use Topics  . Alcohol use: No  . Drug use: No     Allergies   Lisinopril and Oxycodone   Review of Systems Review of Systems  Constitutional: Negative for chills and fever.  HENT: Negative.   Respiratory: Negative for cough and shortness of breath.     Cardiovascular: Negative for chest pain.  Gastrointestinal: Positive for abdominal pain, nausea and vomiting. Negative for blood in stool, constipation and diarrhea.  Genitourinary: Negative for dysuria and frequency.  Musculoskeletal: Negative for arthralgias and myalgias.  Skin: Negative for color change and rash.  Neurological: Positive for weakness (Generalized) and light-headedness. Negative for dizziness, syncope and headaches.     Physical Exam Updated Vital Signs BP 91/64 (BP Location: Right Arm)   Pulse (!) 113   Temp 97.8 F (36.6 C) (Oral)   Ht 5\' 2"  (1.575 m)   Wt 59 kg   SpO2 99%   BMI 23.78 kg/m   Physical Exam Vitals signs and nursing note reviewed.  Constitutional:      General: She is not in acute distress.    Appearance: Normal appearance. She is well-developed and normal weight. She is not diaphoretic.     Comments: Patient appears chronically ill but is in no acute distress, alert and oriented but not very interested in participating in history  HENT:     Head: Normocephalic and atraumatic.     Mouth/Throat:     Mouth: Mucous membranes are dry.     Pharynx: Oropharynx is clear.  Eyes:     General:        Right eye: No discharge.        Left eye: No discharge.     Pupils: Pupils are equal, round, and reactive to light.  Neck:     Musculoskeletal: Neck supple.  Cardiovascular:     Rate and Rhythm: Regular rhythm. Tachycardia present.     Pulses: Normal pulses.     Heart sounds: Normal heart sounds. No murmur. No friction rub. No gallop.   Pulmonary:     Effort: Pulmonary effort is normal. No respiratory distress.     Breath sounds: Normal breath sounds. No wheezing or rales.     Comments: Respirations equal and unlabored, patient able to speak in full sentences, lungs clear to auscultation bilaterally Abdominal:     General: Bowel sounds are normal. There is no distension.     Palpations: Abdomen is soft. There is no mass.     Tenderness: There is  abdominal tenderness. There is no guarding.     Comments: Abdomen is soft and nondistended, bowel sounds present throughout, there is generalized tenderness throughout the abdomen, most notable in the periumbilical region but there is no guarding, no rebound tenderness or rigidity  Musculoskeletal:        General: No deformity.  Skin:    General: Skin is warm and dry.     Capillary Refill: Capillary refill takes less than 2 seconds.  Neurological:     Mental Status: She is alert.     Coordination: Coordination normal.     Comments: Speech is clear, able to follow commands Moves extremities without ataxia, coordination intact  Psychiatric:        Mood and Affect: Mood normal.        Behavior: Behavior normal.      ED Treatments / Results  Labs (all labs ordered are listed, but only abnormal results are displayed) Labs Reviewed  CBC WITH DIFFERENTIAL/PLATELET - Abnormal; Notable for the following components:      Result Value   WBC 15.7 (*)    Platelets 407 (*)    Neutro Abs 12.7 (*)    Abs Immature Granulocytes 0.11 (*)    All other components within normal limits  CBG MONITORING, ED - Abnormal; Notable for the following components:   Glucose-Capillary 268 (*)    All other components within normal limits  COMPREHENSIVE METABOLIC PANEL  URINALYSIS, ROUTINE W REFLEX MICROSCOPIC  I-STAT BETA HCG BLOOD, ED ([facility], [facility], [facility] ONLY)  I-STAT TROPONIN, ED    EKG EKG Interpretation  Date/Time:  Wednesday August 16 2018 14:38:56 EST Ventricular Rate:  114 PR Interval:    QRS Duration: 74 QT Interval:  447 QTC Calculation: 616 R Axis:   100 Text Interpretation:  Right and left arm electrode reversal, interpretation assumes no reversal Sinus tachycardia Borderline right axis deviation Nonspecific T abnormalities, diffuse leads Prolonged QT interval Since last EKG, TWI diffusely No ST elevations or new depressions Confirmed by Shaune Pollack [phone]) on 08/16/2018 3:07:23  PM   Radiology No results found.  Procedures .Critical Care Performed by: Dartha Lodge, PA-C Authorized by: Dartha Lodge, PA-C   Critical care provider statement:    Critical care time (minutes):  45   Critical care time was exclusive of:  Separately billable procedures and treating other patients and teaching time   Critical care was necessary to treat or prevent imminent or life-threatening deterioration of the following conditions:  Endocrine crisis and metabolic crisis (DKA)   Critical care was time spent personally by me on the following activities:  Discussions with consultants, evaluation of patient's response to treatment, examination of patient, ordering and performing treatments and interventions, ordering and review of laboratory studies, ordering and review of radiographic studies, pulse oximetry, re-evaluation of patient's condition, obtaining history from patient or surrogate and review of old charts   I assumed direction of critical care for this patient from another provider in my specialty: no     (including critical care time)  Medications Ordered in ED Medications  sodium chloride 0.9 % bolus 1,000 mL (1,000 mLs Intravenous New Bag/Given 08/16/18 1553)  sodium chloride 0.9 % bolus 1,000 mL (1,000 mLs Intravenous New Bag/Given 08/16/18 1542)  famotidine (PEPCID) IVPB 20 mg premix (20 mg Intravenous New Bag/Given 08/16/18 1544)  ondansetron (ZOFRAN) injection 4 mg (4 mg Intravenous Given 08/16/18 1546)  dicyclomine (BENTYL) injection 20 mg (20 mg Intramuscular Given 08/16/18 1547)  metoCLOPramide (REGLAN) injection 5 mg (5 mg Intravenous Given 08/16/18 1548)  diphenhydrAMINE (BENADRYL) injection 25 mg (25 mg Intravenous Given 08/16/18 1548)     Initial Impression / Assessment and Plan / ED Course  I have reviewed the triage vital signs and the nursing notes.  Pertinent labs & imaging results that were available during my care of the patient were reviewed by me and  considered in my medical decision making (see chart for details).  Patient presents to the emergency department from EMS for evaluation of nausea, vomiting and near syncopal episode, started feeling nauseated yesterday and today has had several episodes of vomiting and is dry heaving on arrival.  CBG reported to be  in the 300s with EMS, 268 on arrival.  Patient has history of type 1 diabetes with multiple episodes of DKA and also has history of gastroparesis.  Complaining of generalized abdominal pain and reports this is consistent with her prior gastroparesis flares.  No meds prior to arrival to treat symptoms.  Patient has generalized tenderness on exam but no focal guarding, not concerning for acute surgical abdomen.  Patient has not had any hematemesis.  No chest pain or shortness of breath and no cough, no fevers or chills.  Lower suspicion for DKA  given glucose but will check basic abdominal labs as well as EKG. EKG does have some T wave inversions will check troponin as well given near syncopal episode and patient's risk factors although patient denies any current chest pain.  Will give IV fluids as patient appears dehydrated and is tachycardic on arrival, antiemetics, Pepcid and Bentyl for symptomatic management of likely gastroparesis flare.  CBG on arrival of 268, patient does have a leukocytosis of 15.7 which I feel like is likely a stress reaction as patient is afebrile with reassuring abdominal exam and no other focal infectious symptoms.  Normal hemoglobin.  Negative troponin and pregnancy test.  Awaiting electrolytes and urinalysis.  On CMP a glucose of 305 with a bicarb of 16 and an anion gap of 24 consistent with DKA, creatinine is also slightly elevated from baseline usually around 1.0-1.3 currently 1.91, LFTs slightly elevated with a T bili of 1.6, patient does not have any focal right upper quadrant tenderness and no positive Murphy sign.  Will start patient on glucose stabilizer protocol  with insulin drip given potassium of 4.1 patient will also be given 2 rounds of IV potassium in anticipation of this lowering with insulin.  Patient will need to be admitted for continuation of insulin drip  Case discussed with Dr. Nelson ChimesAmin with [facility] hospitalist who will see and admit the patient for DKA, does request an ABG be ordered.  Final Clinical Impressions(s) / ED Diagnoses   Final diagnoses:  Diabetic ketoacidosis without coma associated with type 1 diabetes mellitus (HCC)  Non-intractable vomiting with nausea, unspecified vomiting type  Gastroparesis    ED Discharge Orders    None       Legrand RamsFord, John Vasconcelos N, PA-C 08/16/18 1648    Shaune PollackIsaacs, Cameron, MD 08/17/18 [phone]

## 2018-08-16 NOTE — ED Triage Notes (Signed)
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 Witnesses described to EMS that patient was vomiting and had a syncopal event. EMS reports that patient was dry heaving on arrival, she reports feeling nausea, she reports that she has gastroparesis and she is having a "flare up"  On arrival to [facility], patient appears lethargic, she is A&O X4, she reports Hx: gastroparesis(9yrs), she also reports hx: diabetes.

## 2018-08-16 NOTE — ED Provider Notes (Signed)
Ultrasound ED Peripheral IV (Provider) Date/Time: 08/16/2018 3:07 PM Performed by: Shaune PollackIsaacs, Africa Masaki, MD Authorized by: Shaune PollackIsaacs, Mirage Pfefferkorn, MD   Procedure details:    Indications: hydration, multiple failed IV attempts and poor IV access     Skin Prep: chlorhexidine gluconate     Location:  Right forearm   Angiocath:  20 G   Bedside Ultrasound Guided: Yes     Images: archived     Patient tolerated procedure without complications: No     Dressing applied: No        Shaune PollackIsaacs, Alvey Brockel, MD 08/16/18 1521

## 2018-08-16 NOTE — Progress Notes (Signed)
PIV consult: Arrived to ED, provider has already established site. Cancel consult.

## 2018-08-16 NOTE — Progress Notes (Signed)
Patient refusing IV potassium.  Patient stated its burning.  Notified Donnamarie Poag, NP.  Will continue to monitor the patient and notify as needed.

## 2018-08-16 NOTE — H&P (Signed)
History and Physical    Tricia GrayLakeyvia A Notz ZOX:[id number] DOB: [birth year] DOA: 08/16/2018  PCP: [facility] Patient coming from: Home  Chief Complaint: Nausea vomiting  HPI: Tricia Boone is a 28 y.o. female with medical history significant of history of type 1 diabetes, peripheral neuropathy, gastroparesis came to the hospital with complains of nausea and vomiting.  Patient states yesterday afternoon she started experiencing nonspecific abdominal discomfort with nausea and nonbloody nonbilious vomiting.  Since then she has had poor oral intake.  Admits of medication compliance including her insulin.  [facility].  Today when she was at work she continued to feel " terrible" therefore came to the hospital for further care and evaluation.  In the ER patient was noted to be slightly hyperglycemic but herAnion gap was elevated at 24.  She also had slight leukocytosis but no urinary symptoms or respiratory symptoms.  She was started on insulin drip and medical team was requested to admit the patient.  She tells me she takes Levemir 30 units twice daily and 20-25 units of NovoLog pre-meals.  Review of Systems: As per HPI otherwise 10 point review of systems negative.  Review of Systems Otherwise negative except as per HPI, including: General: Denies fever, chills, night sweats or unintended weight loss. Resp: Denies cough, wheezing, shortness of breath. Cardiac: Denies chest pain, palpitations, orthopnea, paroxysmal nocturnal dyspnea. GI: Denies abdominal pain, nausea, vomiting, diarrhea or constipation GU: Denies dysuria, frequency, hesitancy or incontinence MS: Denies muscle aches, joint pain or swelling Neuro: Denies headache, neurologic deficits (focal weakness, numbness, tingling), abnormal gait Psych: Denies anxiety, depression, SI/HI/AVH Skin: Denies new rashes or lesions ID: Denies sick contacts, exotic exposures,  travel  Past Medical History:  Diagnosis Date  . Diabetes mellitus without complication (HCC)   . GERD (gastroesophageal reflux disease)   . High cholesterol   . Hypertension     Past Surgical History:  Procedure Laterality Date  . INCISION AND DRAINAGE BREAST ABSCESS      SOCIAL HISTORY:  reports that she has never smoked. She has never used smokeless tobacco. She reports that she does not drink alcohol or use drugs.  Allergies  Allergen Reactions  . Lisinopril Nausea Only    Diaphoretic, too  . Oxycodone Itching    FAMILY HISTORY: Family History  Problem Relation Age of Onset  . Hypertension Other      Prior to Admission medications   Medication Sig Start Date End Date Taking? Authorizing Provider  diazepam (VALIUM) 5 MG tablet Take 1 tablet (5 mg total) by mouth every 8 (eight) hours as needed (TMJ spasms; may cause drowsiness). Patient taking differently: Take 5 mg by mouth every 8 (eight) hours as needed (for TMJ spasms- may cause drowsiness).  01/09/18  Yes Molpus, John, MD  gabapentin (NEURONTIN) 300 MG capsule Take 300 mg by mouth 3 (three) times daily.   Yes [provider]  insulin aspart (NOVOLOG) 100 UNIT/ML injection Inject 25 Units into the skin 3 (three) times daily before meals.    Yes [provider]  insulin detemir (LEVEMIR) 100 UNIT/ML injection Inject 0.3 mLs (30 Units total) into the skin 2 (two) times daily. 01/08/17  Yes Noralee Stainhoi, Jennifer, DO  loratadine (CLARITIN) 10 MG tablet Take 1 tablet (10 mg total) by mouth daily. Patient taking differently: Take 10 mg by mouth daily as needed for allergies.  11/23/16 08/16/18 Yes Shaune PollackIsaacs, Cameron, MD  metoCLOPramide (REGLAN) 5 MG tablet  Take 1 tablet (5 mg total) by mouth every 6 (six) hours as needed for nausea. 09/24/16  Yes Albertine Grates, MD  ondansetron (ZOFRAN) 4 MG tablet Take 4 mg by mouth every 8 (eight) hours as needed for nausea or vomiting.  07/06/14  Yes [provider]  pantoprazole  (PROTONIX) 40 MG tablet Take 40 mg by mouth at bedtime.    Yes [provider]    Physical Exam: Vitals:   08/16/18 1615 08/16/18 1630 08/16/18 1645 08/16/18 1700  BP: (!) 128/96 122/80 119/75 108/71  Pulse: (!) 118 (!) 115 (!) 109 (!) 105  Resp: 14 11 13 13   Temp:      TempSrc:      SpO2: 100% 100% 100% 100%  Weight:      Height:          Constitutional: NAD, calm, comfortable Eyes: PERRL, lids and conjunctivae normal ENMT: Mucous membranes are moist. Posterior pharynx clear of any exudate or lesions.Normal dentition.  Neck: normal, supple, no masses, no thyromegaly Respiratory: clear to auscultation bilaterally, no wheezing, no crackles. Normal respiratory effort. No accessory muscle use.  Cardiovascular: Regular rate and rhythm, no murmurs / rubs / gallops. No extremity edema. 2+ pedal pulses. No carotid bruits.  Abdomen: no tenderness, no masses palpated. No hepatosplenomegaly. Bowel sounds positive.  Musculoskeletal: no clubbing / cyanosis. No joint deformity upper and lower extremities. Good ROM, no contractures. Normal muscle tone.  Skin: no rashes, lesions, ulcers. No induration Neurologic: CN 2-12 grossly intact. Sensation intact, DTR normal. Strength 5/5 in all 4.  Psychiatric: Normal judgment and insight. Alert and oriented x 3. Normal mood.     Labs on Admission: I have personally reviewed following labs and imaging studies  CBC: Recent Labs  Lab 08/16/18 1518  WBC 15.7*  NEUTROABS 12.7*  HGB 13.3  HCT 40.3  MCV 87.8  PLT 407*   Basic Metabolic Panel: Recent Labs  Lab 08/16/18 1518  NA 140  K 4.1  CL 100  CO2 16*  GLUCOSE 305*  BUN 28*  CREATININE 1.91*  CALCIUM 9.7   GFR: Estimated Creatinine Clearance: 34.4 mL/min (A) (by C-G formula based on SCr of 1.91 mg/dL (H)). Liver Function Tests: Recent Labs  Lab 08/16/18 1518  AST 58*  ALT 96*  ALKPHOS 93  BILITOT 1.6*  PROT 8.0  ALBUMIN 3.9   No results for input(s): LIPASE,  AMYLASE in the last 168 hours. No results for input(s): AMMONIA in the last 168 hours. Coagulation Profile: No results for input(s): INR, PROTIME in the last 168 hours. Cardiac Enzymes: No results for input(s): CKTOTAL, CKMB, CKMBINDEX, TROPONINI in the last 168 hours. BNP (last 3 results) No results for input(s): PROBNP in the last 8760 hours. HbA1C: No results for input(s): HGBA1C in the last 72 hours. CBG: Recent Labs  Lab 08/16/18 1508  GLUCAP 268*   Lipid Profile: No results for input(s): CHOL, HDL, LDLCALC, TRIG, CHOLHDL, LDLDIRECT in the last 72 hours. Thyroid Function Tests: No results for input(s): TSH, T4TOTAL, FREET4, T3FREE, THYROIDAB in the last 72 hours. Anemia Panel: No results for input(s): VITAMINB12, FOLATE, FERRITIN, TIBC, IRON, RETICCTPCT in the last 72 hours. Urine analysis:    Component Value Date/Time   COLORURINE YELLOW 01/08/2017 0251   APPEARANCEUR CLEAR 01/08/2017 0251   LABSPEC 1.026 01/08/2017 0251   PHURINE 5.5 01/08/2017 0251   GLUCOSEU >=500 (A) 01/08/2017 0251   HGBUR NEGATIVE 01/08/2017 0251   BILIRUBINUR MODERATE (A) 01/08/2017 0251   KETONESUR 40 (  A) 01/08/2017 0251   PROTEINUR 30 (A) 01/08/2017 0251   UROBILINOGEN 0.2 05/15/2015 1000   NITRITE NEGATIVE 01/08/2017 0251   LEUKOCYTESUR NEGATIVE 01/08/2017 0251   Sepsis Labs: !!!!!!!!!!!!!!!!!!!!!!!!!!!!!!!!!!!!!!!!!!!! @LABRCNTIP (procalcitonin:4,lacticidven:4) )No results found for this or any previous visit (from the past 240 hour(s)).   Radiological Exams on Admission: No results found.   All images have been reviewed by me personally.    Assessment/Plan Principal Problem:   DKA, type 1 (HCC) Active Problems:   Type 1 diabetes mellitus (HCC)   Renal insufficiency   ARF (acute renal failure) (HCC)    Diabetic ketoacidosis Insulin-dependent diabetes mellitus type 1, uncontrolled -Admit the patient for treatment of DKA - We will place the patient on DKA protocol-including  insulin drip, fluids and electrolyte protocol -IV fluid for maintenance, will change to D5 once blood sugar falls below 250 - Closely monitor potassium and magnesium -ABG/VBG= pending, ordered -Periodically check BMP and magnesium -Once the anion gap closes and patient is able to tolerate oral, start patient on diabetic diet and transition to long-acting subcutaneous insulin.  Turn off the drip after 1-2 hours of getting subcu insulin. -Closely monitor urine output -Antiemetics as necessary -Check hemoglobin A1c -Education for diabetes has been provided, diabetic coordinator consulted - Will need to work outpatient closely with patient's PCP and/or endocrinology to aggressively control diabetes. -Per records she is on Levemir 30 units twice daily and 20-25 units of NovoLog pre-meals.  Acute kidney injury -Baseline creatinine 1.0, today's 1.91.  Avoid nephrotoxic drugs.  Monitor urine output.  Getting hydration.  Leukocytosis -Suspect it is reactive but will check UA to rule out urinary tract infection.  No respiratory symptoms.  Chronic diabetic gastroparesis - Continue Reglan for nausea and its promotility agent.  Peripheral neuropathy secondary to diabetes mellitus type 1 -Continue gabapentin 30 mg 3 times daily  GERD -PPI   DVT prophylaxis: Hep SQ Code Status: Full Code  Family Communication: Mother at bedside Disposition Plan:  TBD Consults called: None Admission status: Stepdown   Time Spent: 65 minutes.  >50% of the time was devoted to discussing the patients care, assessment, plan and disposition with other care givers along with counseling the patient about the risks and benefits of treatment.    Adalaide Jaskolski Joline Maxcyhirag Eleanore Junio MD [facility]  If 7PM-7AM, please contact night-coverage [email or web address]  08/16/2018, 5:03 PM

## 2018-08-17 ENCOUNTER — Encounter (HOSPITAL_COMMUNITY): Payer: Self-pay

## 2018-08-17 DIAGNOSIS — E1010 Type 1 diabetes mellitus with ketoacidosis without coma: Secondary | ICD-10-CM | POA: Diagnosis not present

## 2018-08-17 DIAGNOSIS — N179 Acute kidney failure, unspecified: Secondary | ICD-10-CM | POA: Diagnosis not present

## 2018-08-17 LAB — CBC
HCT: 34.8 % — ABNORMAL LOW (ref 36.0–46.0)
HEMOGLOBIN: 11 g/dL — AB (ref 12.0–15.0)
MCH: 27.6 pg (ref 26.0–34.0)
MCHC: 31.6 g/dL (ref 30.0–36.0)
MCV: 87.2 fL (ref 80.0–100.0)
Platelets: 338 10*3/uL (ref 150–400)
RBC: 3.99 MIL/uL (ref 3.87–5.11)
RDW: 13.6 % (ref 11.5–15.5)
WBC: 16.5 10*3/uL — ABNORMAL HIGH (ref 4.0–10.5)
nRBC: 0 % (ref 0.0–0.2)

## 2018-08-17 LAB — GLUCOSE, CAPILLARY
GLUCOSE-CAPILLARY: 125 mg/dL — AB (ref 70–99)
Glucose-Capillary: 113 mg/dL — ABNORMAL HIGH (ref 70–99)
Glucose-Capillary: 117 mg/dL — ABNORMAL HIGH (ref 70–99)
Glucose-Capillary: 118 mg/dL — ABNORMAL HIGH (ref 70–99)
Glucose-Capillary: 120 mg/dL — ABNORMAL HIGH (ref 70–99)
Glucose-Capillary: 145 mg/dL — ABNORMAL HIGH (ref 70–99)
Glucose-Capillary: 148 mg/dL — ABNORMAL HIGH (ref 70–99)
Glucose-Capillary: 153 mg/dL — ABNORMAL HIGH (ref 70–99)
Glucose-Capillary: 168 mg/dL — ABNORMAL HIGH (ref 70–99)
Glucose-Capillary: 179 mg/dL — ABNORMAL HIGH (ref 70–99)
Glucose-Capillary: 204 mg/dL — ABNORMAL HIGH (ref 70–99)
Glucose-Capillary: 233 mg/dL — ABNORMAL HIGH (ref 70–99)
Glucose-Capillary: 322 mg/dL — ABNORMAL HIGH (ref 70–99)
Glucose-Capillary: 382 mg/dL — ABNORMAL HIGH (ref 70–99)

## 2018-08-17 LAB — BASIC METABOLIC PANEL
Anion gap: 16 — ABNORMAL HIGH (ref 5–15)
Anion gap: 12 (ref 5–15)
BUN: 20 mg/dL (ref 6–20)
BUN: 16 mg/dL (ref 6–20)
CO2: 16 mmol/L — ABNORMAL LOW (ref 22–32)
CO2: 18 mmol/L — ABNORMAL LOW (ref 22–32)
Calcium: 7.9 mg/dL — ABNORMAL LOW (ref 8.9–10.3)
Calcium: 7.8 mg/dL — ABNORMAL LOW (ref 8.9–10.3)
Chloride: 110 mmol/L (ref 98–111)
Chloride: 108 mmol/L (ref 98–111)
Creatinine, Ser: 1.23 mg/dL — ABNORMAL HIGH (ref 0.44–1.00)
Creatinine, Ser: 1.02 mg/dL — ABNORMAL HIGH (ref 0.44–1.00)
GFR calc Af Amer: >60 mL/min (ref >60)
GFR calc non Af Amer: 59 mL/min — ABNORMAL LOW (ref >60)
GFR calc non Af Amer: >60 mL/min (ref >60)
GLUCOSE: 163 mg/dL — AB (ref 70–99)
Glucose, Bld: 310 mg/dL — ABNORMAL HIGH (ref 70–99)
Potassium: 3.5 mmol/L (ref 3.5–5.1)
Potassium: 4.3 mmol/L (ref 3.5–5.1)
Sodium: 142 mmol/L (ref 135–145)
Sodium: 138 mmol/L (ref 135–145)

## 2018-08-17 LAB — HIV ANTIBODY (ROUTINE TESTING W REFLEX): HIV SCREEN 4TH GENERATION: NONREACTIVE

## 2018-08-17 MED ORDER — POTASSIUM CHLORIDE CRYS ER 20 MEQ PO TBCR
20.0000 meq | EXTENDED_RELEASE_TABLET | Freq: Once | ORAL | Status: AC
  Administered 2018-08-17: 20 meq via ORAL
  Filled 2018-08-17: qty 1

## 2018-08-17 MED ORDER — INSULIN ASPART 100 UNIT/ML ~~LOC~~ SOLN
5.0000 [IU] | Freq: Three times a day (TID) | SUBCUTANEOUS | Status: DC
  Administered 2018-08-17: 5 [IU] via SUBCUTANEOUS

## 2018-08-17 MED ORDER — INSULIN ASPART 100 UNIT/ML ~~LOC~~ SOLN
0.0000 [IU] | Freq: Three times a day (TID) | SUBCUTANEOUS | Status: DC
  Administered 2018-08-17: 9 [IU] via SUBCUTANEOUS
  Administered 2018-08-18: 7 [IU] via SUBCUTANEOUS

## 2018-08-17 MED ORDER — INSULIN DETEMIR 100 UNIT/ML ~~LOC~~ SOLN
20.0000 [IU] | Freq: Two times a day (BID) | SUBCUTANEOUS | Status: DC
  Administered 2018-08-17 (×2): 20 [IU] via SUBCUTANEOUS
  Filled 2018-08-17 (×4): qty 0.2

## 2018-08-17 MED ORDER — LACTATED RINGERS IV BOLUS
1000.0000 mL | Freq: Once | INTRAVENOUS | Status: AC
  Administered 2018-08-17: 1000 mL via INTRAVENOUS

## 2018-08-17 MED ORDER — INSULIN ASPART 100 UNIT/ML ~~LOC~~ SOLN: 0.0000 [IU] | Freq: Every day | SUBCUTANEOUS | Status: DC

## 2018-08-17 NOTE — Progress Notes (Signed)
Spoke with patient at the bedside. 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 She states that she has gastroparesis.  Her boss sent someone to ride with her at that time. Later, she got out of the truck and became dizzy and passed out.   States that her blood sugar was 130 mg/dl that morning before work and she took her normal dosages of insulin and ate breakfast. The EMS did a blood sugar which was around 385 mg/dl at that time.   Patient states that she had an insulin pump when she was younger. She had been diagnosed at 28 years old. She seems to know her limits and takes her insulin every day. She would like to get back on an insulin pump when she can. States that her appetite seems to be back to normal. Will continue to monitor blood sugars while in the hospital.  Smith Mince RN BSN CDE Diabetes Coordinator Pager: [phone]  8am-5pm

## 2018-08-17 NOTE — Progress Notes (Signed)
Patient arrived to the unit via bed.  Patient is alert and oriented. No complaints of pain.  Skin assessment complete.  Bruising to the lower abdomen.  Educated the patient on how to reach the staff on the unit.  Lowered the bed and placed the call light within reach.  Will continue to monitor the patient and notify MD as needed

## 2018-08-17 NOTE — Care Management Note (Signed)
Case Management Note  Patient Details  Name: Tricia Boone MRN: [id number] Date of Birth: [birth year]  Subjective/Objective:      DKA. From home alone. Independent with ADL's, no DME usage.             Tricia Boone (Mother) Tricia Boone [facility]([place])    [phone]     PCP: Endocrinologist:  Natalia LeatherwoodMeghan Younst  Action/Plan: Transition to home when medically stable. Pt states has glucometer and can afford medications.  Pt has transportation to home.  MD pt would like Rx meds to be sent electronically to her pharmacy. [facility] 2/2 to no cash available or credit card @ d/c.  Expected Discharge Date:     08/18/2018           Expected Discharge Plan:  Home/Self Care  In-House Referral:  NA  Discharge planning Services  CM Consult  Post Acute Care Choice:  NA Choice offered to:  NA  DME Arranged:  N/A DME Agency:  NA  HH Arranged:  NA HH Agency:  NA  Status of Service:  Completed, signed off  If discussed at Long Length of Stay Meetings, dates discussed:    Additional Comments:  Epifanio LeschesCole, Leanndra Pember Hudson, RN 08/17/2018, 1:46 PM

## 2018-08-17 NOTE — Progress Notes (Signed)
Inpatient Diabetes Program Recommendations  AACE/ADA: New Consensus Statement on Inpatient Glycemic Control (2015)  Target Ranges:  Prepandial:   less than 140 mg/dL      Peak postprandial:   less than 180 mg/dL (1-2 hours)      Critically ill patients:  140 - 180 mg/dL   Lab Results  Component Value Date   GLUCAP 118 (H) 08/17/2018   HGBA1C 11.3 (H) 01/08/2017    Review of Glycemic Control  Diabetes history: Type 1 Outpatient Diabetes medications: Levemir 30 U BID, Novolog 25 units TID with meals Current orders for Inpatient glycemic control: IV insulin to transition to : Levemir 20 Units BID, Novolog SENSITIVE TID  & HS.   Inpatient Diabetes Program Recommendations:    Patient takes Novolog 25 units TID with meals at home. Recommend adding Novolog 5  units TID with meals if patient eats at least 50% of meal and if blood sugars are greater than 180 mg/dl. Will continue to monitor blood sugars while in the hospital.  Smith Mince RN BSN CDE Diabetes Coordinator Pager: [phone]  8am-5pm

## 2018-08-17 NOTE — Progress Notes (Signed)
Patient ID: Tricia Boone, female   DOB: Jun 07, 1991, 28 y.o.   MRN: [id number]  PROGRESS NOTE    Tricia Boone  AVW:[id number] DOB: 01/27/1991 DOA: 08/16/2018 PCP: [facility]   Brief Narrative:  28 year old female with history of diabetes mellitus type 1, peripheral neuropathy, gastroparesis presented with nausea and vomiting.  She was found to be in DKA with anion gap of 24 on presentation.  She was started on insulin drip and IV fluids.   Assessment & Plan:   Principal Problem:   DKA, type 1 (HCC) Active Problems:   Type 1 diabetes mellitus (HCC)   Renal insufficiency   ARF (acute renal failure) (HCC)   DKA in a patient with history of insulin-dependent diabetes mellitus type 1 -Anion gap was 24 on presentation.  She was started on IV fluids and insulin drip.  Blood sugars are improving.  Anion gap is 12 this morning.  We will switch to long-acting insulin and subsequently discontinue insulin drip.  Start carb modified diet.  No overnight vomiting.  Diabetes coronary consult.  Acute kidney injury -Probably from above.  Improving.  Repeat a.m. labs  Leukocytosis -Probably reactive.  Monitor  Chronic diabetic gastroparesis -Continue Reglan.  Outpatient follow-up  Peripheral neuropathy -Continue gabapentin  GERD -continue PPI   DVT prophylaxis: Heparin subcutaneous Code Status: Full Family Communication: None at bedside Disposition Plan: Home tomorrow if remains medically stable  Consultants: No  Procedures: None Antimicrobials: None   Subjective: Patient seen and examined at bedside.  She denies any current nausea or vomiting.  She is hungry.  No overnight fever chest pain or shortness of breath.  Objective: Vitals:   08/17/18 0012 08/17/18 0500 08/17/18 0516 08/17/18 0800  BP: 112/81   (!) 128/96  Pulse: 95     Resp: 13     Temp:   98.3 F (36.8 C)   TempSrc:   Oral   SpO2: 100%     Weight:  55.3 kg    Height:         Intake/Output Summary (Last 24 hours) at 08/17/2018 1035 Last data filed at 08/17/2018 5374 Gross per 24 hour  Intake 2251.6 ml  Output -  Net 2251.6 ml   Filed Weights   08/16/18 1447 08/17/18 0500  Weight: 59 kg 55.3 kg    Examination:  General exam: Appears calm and comfortable  Respiratory system: Bilateral decreased breath sounds at bases Cardiovascular system: S1 & S2 heard, Rate controlled Gastrointestinal system: Abdomen is nondistended, soft and nontender. Normal bowel sounds heard. Extremities: No cyanosis, clubbing, edema   Data Reviewed: I have personally reviewed following labs and imaging studies  CBC: Recent Labs  Lab 08/16/18 1518 08/16/18 1719 08/16/18 2111 08/17/18 0700  WBC 15.7*  --  14.8* 16.5*  NEUTROABS 12.7*  --   --   --   HGB 13.3 9.5* 11.2* 11.0*  HCT 40.3 28.0* 35.2* 34.8*  MCV 87.8  --  86.5 87.2  PLT 407*  --  341 338   Basic Metabolic Panel: Recent Labs  Lab 08/16/18 1518 08/16/18 1719 08/16/18 1850 08/16/18 1853 08/17/18 0052 08/17/18 0700  NA 140 142  --  144 142 138  K 4.1 4.0  --  3.9 3.5 4.3  CL 100  --   --  111 110 108  CO2 16*  --   --  17* 16* 18*  GLUCOSE 305*  --   --  181* 163* 310*  BUN 28*  --   --  25* 20 16  CREATININE 1.91*  --  1.39* 1.33* 1.23* 1.02*  CALCIUM 9.7  --   --  8.5* 7.9* 7.8*  MG  --   --  2.4  --   --   --   PHOS  --   --  2.8  --   --   --    GFR: Estimated Creatinine Clearance: 64.4 mL/min (A) (by C-G formula based on SCr of 1.02 mg/dL (H)). Liver Function Tests: Recent Labs  Lab 08/16/18 1518  AST 58*  ALT 96*  ALKPHOS 93  BILITOT 1.6*  PROT 8.0  ALBUMIN 3.9   No results for input(s): LIPASE, AMYLASE in the last 168 hours. No results for input(s): AMMONIA in the last 168 hours. Coagulation Profile: No results for input(s): INR, PROTIME in the last 168 hours. Cardiac Enzymes: No results for input(s): CKTOTAL, CKMB, CKMBINDEX, TROPONINI in the last 168 hours. BNP (last 3  results) No results for input(s): PROBNP in the last 8760 hours. HbA1C: No results for input(s): HGBA1C in the last 72 hours. CBG: Recent Labs  Lab 08/17/18 0511 08/17/18 0606 08/17/18 0701 08/17/18 0812 08/17/18 0920  GLUCAP 120* 233* 322* 179* 148*   Lipid Profile: No results for input(s): CHOL, HDL, LDLCALC, TRIG, CHOLHDL, LDLDIRECT in the last 72 hours. Thyroid Function Tests: No results for input(s): TSH, T4TOTAL, FREET4, T3FREE, THYROIDAB in the last 72 hours. Anemia Panel: No results for input(s): VITAMINB12, FOLATE, FERRITIN, TIBC, IRON, RETICCTPCT in the last 72 hours. Sepsis Labs: No results for input(s): PROCALCITON, LATICACIDVEN in the last 168 hours.  Recent Results (from the past 240 hour(s))  MRSA PCR Screening     Status: None   Collection Time: 08/16/18  8:27 PM  Result Value Ref Range Status   MRSA by PCR NEGATIVE NEGATIVE Final    Comment:        The GeneXpert MRSA Assay (FDA approved for NASAL specimens only), is one component of a comprehensive MRSA colonization surveillance program. It is not intended to diagnose MRSA infection nor to guide or monitor treatment for MRSA infections. Performed at [facility], [address], [place], [place] [address]          Radiology Studies: No results found.      Scheduled Meds: . gabapentin  300 mg Oral TID  . heparin  5,000 Units Subcutaneous Q8H  . insulin aspart  0-5 Units Subcutaneous QHS  . insulin aspart  0-9 Units Subcutaneous TID WC  . insulin detemir  20 Units Subcutaneous BID  . pantoprazole  40 mg Oral QHS   Continuous Infusions: . dextrose 5 % and 0.45% NaCl    . dextrose 5 % and 0.45% NaCl 100 mL/hr at 08/16/18 1732  . insulin 5.2 Units/hr (08/17/18 0709)     LOS: 0 days        Glade Lloyd, MD [facility] Pager [phone]  If 7PM-7AM, please contact night-coverage [email or web address] Password [facility] 08/17/2018, 10:35 AM

## 2018-08-18 DIAGNOSIS — E1010 Type 1 diabetes mellitus with ketoacidosis without coma: Secondary | ICD-10-CM | POA: Diagnosis not present

## 2018-08-18 DIAGNOSIS — N179 Acute kidney failure, unspecified: Secondary | ICD-10-CM

## 2018-08-18 LAB — CBC WITH DIFFERENTIAL/PLATELET
Abs Immature Granulocytes: 0.03 10*3/uL (ref 0.00–0.07)
Basophils Absolute: 0 10*3/uL (ref 0.0–0.1)
Basophils Relative: 0 %
EOS PCT: 4 %
Eosinophils Absolute: 0.5 10*3/uL (ref 0.0–0.5)
HCT: 32.6 % — ABNORMAL LOW (ref 36.0–46.0)
Hemoglobin: 10.2 g/dL — ABNORMAL LOW (ref 12.0–15.0)
Immature Granulocytes: 0 %
Lymphocytes Relative: 42 %
Lymphs Abs: 4.8 10*3/uL — ABNORMAL HIGH (ref 0.7–4.0)
MCH: 27.5 pg (ref 26.0–34.0)
MCHC: 31.3 g/dL (ref 30.0–36.0)
MCV: 87.9 fL (ref 80.0–100.0)
Monocytes Absolute: 0.6 10*3/uL (ref 0.1–1.0)
Monocytes Relative: 5 %
Neutro Abs: 5.5 10*3/uL (ref 1.7–7.7)
Neutrophils Relative %: 49 %
Platelets: 267 10*3/uL (ref 150–400)
RBC: 3.71 MIL/uL — ABNORMAL LOW (ref 3.87–5.11)
RDW: 13.7 % (ref 11.5–15.5)
WBC: 11.5 10*3/uL — AB (ref 4.0–10.5)
nRBC: 0 % (ref 0.0–0.2)

## 2018-08-18 LAB — BASIC METABOLIC PANEL
ANION GAP: 5 (ref 5–15)
BUN: 12 mg/dL (ref 6–20)
CO2: 23 mmol/L (ref 22–32)
CREATININE: 0.76 mg/dL (ref 0.44–1.00)
Calcium: 7.8 mg/dL — ABNORMAL LOW (ref 8.9–10.3)
Chloride: 110 mmol/L (ref 98–111)
GFR calc non Af Amer: >60 mL/min (ref >60)
Glucose, Bld: 58 mg/dL — ABNORMAL LOW (ref 70–99)
Potassium: 3.9 mmol/L (ref 3.5–5.1)
SODIUM: 138 mmol/L (ref 135–145)

## 2018-08-18 LAB — MAGNESIUM: MAGNESIUM: 2.1 mg/dL (ref 1.7–2.4)

## 2018-08-18 LAB — GLUCOSE, CAPILLARY
GLUCOSE-CAPILLARY: 321 mg/dL — AB (ref 70–99)
Glucose-Capillary: 25 mg/dL — CL (ref 70–99)
Glucose-Capillary: 97 mg/dL (ref 70–99)

## 2018-08-18 LAB — HEMOGLOBIN A1C
Hgb A1c MFr Bld: 12.2 % — ABNORMAL HIGH (ref 4.8–5.6)
Mean Plasma Glucose: 303 mg/dL

## 2018-08-18 MED ORDER — INSULIN DETEMIR 100 UNIT/ML ~~LOC~~ SOLN
10.0000 [IU] | Freq: Two times a day (BID) | SUBCUTANEOUS | Status: DC
  Filled 2018-08-18: qty 0.1

## 2018-08-18 MED ORDER — LORATADINE 10 MG PO TABS: 10.0000 mg | ORAL_TABLET | Freq: Every day | ORAL | Status: AC | PRN

## 2018-08-18 MED ORDER — INSULIN DETEMIR 100 UNIT/ML ~~LOC~~ SOLN: 20.0000 [IU] | Freq: Two times a day (BID) | SUBCUTANEOUS | Status: DC

## 2018-08-18 MED ORDER — INSULIN DETEMIR 100 UNIT/ML ~~LOC~~ SOLN: SUBCUTANEOUS | Status: AC

## 2018-08-18 MED ORDER — INSULIN DETEMIR 100 UNIT/ML ~~LOC~~ SOLN
20.0000 [IU] | Freq: Two times a day (BID) | SUBCUTANEOUS | Status: DC
  Administered 2018-08-18: 20 [IU] via SUBCUTANEOUS
  Filled 2018-08-18 (×3): qty 0.2

## 2018-08-18 MED ORDER — INSULIN ASPART 100 UNIT/ML ~~LOC~~ SOLN: 10.0000 [IU] | Freq: Three times a day (TID) | SUBCUTANEOUS | Status: AC

## 2018-08-18 NOTE — Progress Notes (Signed)
Dutch Gray to be D/C'd Home per MD order.  Discussed with the patient and all questions fully answered.  VSS, Skin clean, dry and intact without evidence of skin break down, no evidence of skin tears noted. IV catheter discontinued intact. Site without signs and symptoms of complications. Dressing and pressure applied.  An After Visit Summary was printed and given to the patient. Patient received prescription.  D/c education completed with patient/family including follow up instructions, medication list, d/c activities limitations if indicated, with other d/c instructions as indicated by MD - patient able to verbalize understanding, all questions fully answered.   Patient instructed to return to ED, call 911, or call MD for any changes in condition.   Patient escorted via WC, and D/C home via private auto.  Eligah East 08/18/2018 2:37 PM

## 2018-08-18 NOTE — Discharge Summary (Signed)
Physician Discharge Summary  KENDRAYA HOLADAY IDP:[id number] DOB: Nov 09, 1990 DOA: 08/16/2018  PCP: [facility]  Admit date: 08/16/2018 Discharge date: 08/18/2018  Admitted From: Home Disposition: Home  Recommendations for Outpatient Follow-up:  1. Follow up with PCP in 1 week 2. Outpatient follow-up with endocrinology 3. Follow-up in the ED if symptoms worsen or new.   Home Health: No Equipment/Devices: None  Discharge Condition: Stable CODE STATUS: Full Diet recommendation: Carb modified  Brief/Interim Summary: 28 year old female with history of diabetes mellitus type 1, peripheral neuropathy, gastroparesis presented with nausea and vomiting.  She was found to be in DKA with anion gap of 24 on presentation.  She was started on insulin drip and IV fluids.  After her anion gap closed, she was transitioned to long-acting insulin.  She is tolerating diet, she will be discharged home with outpatient follow-up with PCP and endocrinology.   Discharge Diagnoses:  Principal Problem:   DKA, type 1 (HCC) Active Problems:   Type 1 diabetes mellitus (HCC)   Renal insufficiency   ARF (acute renal failure) (HCC)  DKA in a patient with history of insulin-dependent diabetes mellitus type 1 -Anion gap was 24 on presentation.  She was started on IV fluids and insulin drip.  -After her anion gap closed, she was transitioned to long-acting insulin -She is tolerating diet. -She was hypoglycemic with blood sugar of 25 this morning.  No changes mental status.  After food, her blood sugar is again on the higher side.  Will give Levemir 20 units now and patient feels okay to be discharged.  On discharge, patient will be put on Levemir 30 units in a.m. and 10 units in p.m. along with NovoLog 10 units 3 times a day with meals the patient sees her primary care provider.  She will benefit from outpatient follow-up with endocrinology.    Acute kidney injury -Probably from  above.  Improving.  Repeat a.m. labs  Leukocytosis -Probably reactive.  Monitor  Chronic diabetic gastroparesis -Continue Reglan.  Outpatient follow-up  Peripheral neuropathy -Continue gabapentin  GERD -continue PPI  Discharge Instructions  Discharge Instructions    Call MD for:  extreme fatigue   Complete by:  As directed    Call MD for:  persistant dizziness or light-headedness   Complete by:  As directed    Call MD for:  persistant nausea and vomiting   Complete by:  As directed    Call MD for:  temperature >100.4   Complete by:  As directed    Diet Carb Modified   Complete by:  As directed    Increase activity slowly   Complete by:  As directed      Allergies as of 08/18/2018      Reactions   Lisinopril Nausea Only   Diaphoretic, too   Oxycodone Itching      Medication List    TAKE these medications   diazepam 5 MG tablet Commonly known as:  VALIUM Take 1 tablet (5 mg total) by mouth every 8 (eight) hours as needed (TMJ spasms; may cause drowsiness). What changed:  reasons to take this   gabapentin 300 MG capsule Commonly known as:  NEURONTIN Take 300 mg by mouth 3 (three) times daily.   insulin aspart 100 UNIT/ML injection Commonly known as:  novoLOG Inject 10 Units into the skin 3 (three) times daily with meals. What changed:    how much to take  when to take this   insulin detemir 100 UNIT/ML  injection Commonly known as:  LEVEMIR 30 units in AM, 10units in PM till evaluation by PCP What changed:    how much to take  how to take this  when to take this  additional instructions   loratadine 10 MG tablet Commonly known as:  CLARITIN Take 1 tablet (10 mg total) by mouth daily as needed for up to 10 days for allergies.   metoCLOPramide 5 MG tablet Commonly known as:  REGLAN Take 1 tablet (5 mg total) by mouth every 6 (six) hours as needed for nausea.   ondansetron 4 MG tablet Commonly known as:  ZOFRAN Take 4 mg by mouth every 8  (eight) hours as needed for nausea or vomiting.   pantoprazole 40 MG tablet Commonly known as:  PROTONIX Take 40 mg by mouth at bedtime.       Follow-up Information    [facility]. Schedule an appointment as soon as possible for a visit in 1 week(s).   Specialty:  Pediatrics Contact information: [address] [place] [address] [phone]          Allergies  Allergen Reactions  . Lisinopril Nausea Only    Diaphoretic, too  . Oxycodone Itching    Consultations:  None   Procedures/Studies: None  Subjective: Patient seen and examined at bedside.  She denies any overnight fever, nausea or vomiting.  She is tolerating diet.  She feels okay to be discharged today.  Discharge Exam:  Vitals:   08/17/18 0800 08/17/18 2217 08/18/18 0455 08/18/18 0500  BP: (!) 128/96 (!) 132/92 117/80   Pulse:  (!) 101 91   Resp:  17 16   Temp:  98.4 F (36.9 C) 97.9 F (36.6 C)   TempSrc:  Oral Oral   SpO2:  100% 100%   Weight:    57.9 kg  Height:        General: Pt is alert, awake, not in acute distress Cardiovascular: rate controlled, S1/S2 + Respiratory: bilateral decreased breath sounds at bases Abdominal: Soft, NT, ND, bowel sounds + Extremities: no edema, no cyanosis    The results of significant diagnostics from this hospitalization (including imaging, microbiology, ancillary and laboratory) are listed below for reference.     Microbiology: Recent Results (from the past 240 hour(s))  MRSA PCR Screening     Status: None   Collection Time: 08/16/18  8:27 PM  Result Value Ref Range Status   MRSA by PCR NEGATIVE NEGATIVE Final    Comment:        The GeneXpert MRSA Assay (FDA approved for NASAL specimens only), is one component of a comprehensive MRSA colonization surveillance program. It is not intended to diagnose MRSA infection nor to guide or monitor treatment for MRSA infections. Performed at [facility]  [facility], [address] [place], [place] [address]      Labs: BNP (last 3 results) No results for input(s): BNP in the last 8760 hours. Basic Metabolic Panel: Recent Labs  Lab 08/16/18 1518 08/16/18 1719 08/16/18 1850 08/16/18 1853 08/17/18 0052 08/17/18 0700 08/18/18 0447  NA 140 142  --  144 142 138 138  K 4.1 4.0  --  3.9 3.5 4.3 3.9  CL 100  --   --  111 110 108 110  CO2 16*  --   --  17* 16* 18* 23  GLUCOSE 305*  --   --  181* 163* 310* 58*  BUN 28*  --   --  25* 20 16 12   CREATININE 1.91*  --  1.39* 1.33* 1.23* 1.02* 0.76  CALCIUM 9.7  --   --  8.5* 7.9* 7.8* 7.8*  MG  --   --  2.4  --   --   --  2.1  PHOS  --   --  2.8  --   --   --   --    Liver Function Tests: Recent Labs  Lab 08/16/18 1518  AST 58*  ALT 96*  ALKPHOS 93  BILITOT 1.6*  PROT 8.0  ALBUMIN 3.9   No results for input(s): LIPASE, AMYLASE in the last 168 hours. No results for input(s): AMMONIA in the last 168 hours. CBC: Recent Labs  Lab 08/16/18 1518 08/16/18 1719 08/16/18 2111 08/17/18 0700 08/18/18 0447  WBC 15.7*  --  14.8* 16.5* 11.5*  NEUTROABS 12.7*  --   --   --  5.5  HGB 13.3 9.5* 11.2* 11.0* 10.2*  HCT 40.3 28.0* 35.2* 34.8* 32.6*  MCV 87.8  --  86.5 87.2 87.9  PLT 407*  --  341 338 267   Cardiac Enzymes: No results for input(s): CKTOTAL, CKMB, CKMBINDEX, TROPONINI in the last 168 hours. BNP: Invalid input(s): POCBNP CBG: Recent Labs  Lab 08/17/18 1646 08/17/18 2215 08/18/18 0830 08/18/18 0907 08/18/18 1216  GLUCAP 382* 153* 25* 97 321*   D-Dimer No results for input(s): DDIMER in the last 72 hours. Hgb A1c Recent Labs    08/16/18 2111  HGBA1C 12.2*   Lipid Profile No results for input(s): CHOL, HDL, LDLCALC, TRIG, CHOLHDL, LDLDIRECT in the last 72 hours. Thyroid function studies No results for input(s): TSH, T4TOTAL, T3FREE, THYROIDAB in the last 72 hours.  Invalid input(s): FREET3 Anemia work up No results for input(s): VITAMINB12, FOLATE, FERRITIN, TIBC,  IRON, RETICCTPCT in the last 72 hours. Urinalysis    Component Value Date/Time   COLORURINE YELLOW 01/08/2017 0251   APPEARANCEUR CLEAR 01/08/2017 0251   LABSPEC 1.026 01/08/2017 0251   PHURINE 5.5 01/08/2017 0251   GLUCOSEU >=500 (A) 01/08/2017 0251   HGBUR NEGATIVE 01/08/2017 0251   BILIRUBINUR MODERATE (A) 01/08/2017 0251   KETONESUR 40 (A) 01/08/2017 0251   PROTEINUR 30 (A) 01/08/2017 0251   UROBILINOGEN 0.2 05/15/2015 1000   NITRITE NEGATIVE 01/08/2017 0251   LEUKOCYTESUR NEGATIVE 01/08/2017 0251   Sepsis Labs Invalid input(s): PROCALCITONIN,  WBC,  LACTICIDVEN Microbiology Recent Results (from the past 240 hour(s))  MRSA PCR Screening     Status: None   Collection Time: 08/16/18  8:27 PM  Result Value Ref Range Status   MRSA by PCR NEGATIVE NEGATIVE Final    Comment:        The GeneXpert MRSA Assay (FDA approved for NASAL specimens only), is one component of a comprehensive MRSA colonization surveillance program. It is not intended to diagnose MRSA infection nor to guide or monitor treatment for MRSA infections. Performed at [facility], [address], [place], [place] [address]      Time coordinating discharge: 35 minutes  SIGNED:   Glade Lloyd, MD  [facility] 08/18/2018, 1:15 PM Pager: [phone]  If 7PM-7AM, please contact night-coverage [email or web address] Password [email or web address]

## 2018-08-18 NOTE — Progress Notes (Signed)
Hypoglycemic Event  CBG: 25  Treatment: 8 oz juice/soda  Symptoms: Vision changes  Follow-up CBG: Time:0930 CBG Result:97  Possible Reasons for Event: Unknown  Comments/MD notified: Dr. Hanley Ben notified advised to hold levemir    Eligah East

## 2021-02-20 ENCOUNTER — Emergency Department (HOSPITAL_BASED_OUTPATIENT_CLINIC_OR_DEPARTMENT_OTHER)
Admission: EM | Admit: 2021-02-20 | Discharge: 2021-02-20 | Disposition: A | Discharge status: Home / Self Care | Payer: Medicaid Other | Attending: Emergency Medicine | Admitting: Emergency Medicine

## 2021-02-20 ENCOUNTER — Encounter (HOSPITAL_BASED_OUTPATIENT_CLINIC_OR_DEPARTMENT_OTHER): Payer: Self-pay | Admitting: *Deleted

## 2021-02-20 ENCOUNTER — Other Ambulatory Visit: Payer: Self-pay

## 2021-02-20 ENCOUNTER — Emergency Department (HOSPITAL_BASED_OUTPATIENT_CLINIC_OR_DEPARTMENT_OTHER): Payer: Medicaid Other

## 2021-02-20 ENCOUNTER — Encounter: Payer: Self-pay | Admitting: Emergency Medicine

## 2021-02-20 ENCOUNTER — Ambulatory Visit
Admission: EM | Admit: 2021-02-20 | Discharge: 2021-02-20 | Disposition: A | Discharge status: Home / Self Care | Payer: Medicaid Other | Attending: Urgent Care | Admitting: Urgent Care

## 2021-02-20 DIAGNOSIS — R519 Headache, unspecified: Secondary | ICD-10-CM | POA: Diagnosis present

## 2021-02-20 DIAGNOSIS — E1010 Type 1 diabetes mellitus with ketoacidosis without coma: Secondary | ICD-10-CM | POA: Insufficient documentation

## 2021-02-20 DIAGNOSIS — E1065 Type 1 diabetes mellitus with hyperglycemia: Secondary | ICD-10-CM

## 2021-02-20 DIAGNOSIS — I110 Hypertensive heart disease with heart failure: Secondary | ICD-10-CM | POA: Insufficient documentation

## 2021-02-20 DIAGNOSIS — Z8679 Personal history of other diseases of the circulatory system: Secondary | ICD-10-CM

## 2021-02-20 DIAGNOSIS — H5713 Ocular pain, bilateral: Secondary | ICD-10-CM

## 2021-02-20 DIAGNOSIS — I509 Heart failure, unspecified: Secondary | ICD-10-CM | POA: Diagnosis not present

## 2021-02-20 DIAGNOSIS — H5789 Other specified disorders of eye and adnexa: Secondary | ICD-10-CM

## 2021-02-20 HISTORY — DX: Heart failure, unspecified: I50.9

## 2021-02-20 LAB — CBC WITH DIFFERENTIAL/PLATELET
Abs Immature Granulocytes: 0.02 10*3/uL (ref 0.00–0.07)
Basophils Absolute: 0.1 10*3/uL (ref 0.0–0.1)
Basophils Relative: 1 %
Eosinophils Absolute: 0.5 10*3/uL (ref 0.0–0.5)
Eosinophils Relative: 7 %
HCT: 32.1 % — ABNORMAL LOW (ref 36.0–46.0)
Hemoglobin: 10.5 g/dL — ABNORMAL LOW (ref 12.0–15.0)
Immature Granulocytes: 0 %
Lymphocytes Relative: 26 %
Lymphs Abs: 2 10*3/uL (ref 0.7–4.0)
MCH: 27.5 pg (ref 26.0–34.0)
MCHC: 32.7 g/dL (ref 30.0–36.0)
MCV: 84 fL (ref 80.0–100.0)
Monocytes Absolute: 0.6 10*3/uL (ref 0.1–1.0)
Monocytes Relative: 7 %
Neutro Abs: 4.8 10*3/uL (ref 1.7–7.7)
Neutrophils Relative %: 59 %
Platelets: 237 10*3/uL (ref 150–400)
RBC: 3.82 MIL/uL — ABNORMAL LOW (ref 3.87–5.11)
RDW: 13.2 % (ref 11.5–15.5)
WBC: 8 10*3/uL (ref 4.0–10.5)
nRBC: 0 % (ref 0.0–0.2)

## 2021-02-20 LAB — COMPREHENSIVE METABOLIC PANEL
ALT: 41 U/L (ref 0–44)
AST: 43 U/L — ABNORMAL HIGH (ref 15–41)
Albumin: 2.9 g/dL — ABNORMAL LOW (ref 3.5–5.0)
Alkaline Phosphatase: 94 U/L (ref 38–126)
Anion gap: 5 (ref 5–15)
BUN: 24 mg/dL — ABNORMAL HIGH (ref 6–20)
CO2: 23 mmol/L (ref 22–32)
Calcium: 7.7 mg/dL — ABNORMAL LOW (ref 8.9–10.3)
Chloride: 102 mmol/L (ref 98–111)
Creatinine, Ser: 1.52 mg/dL — ABNORMAL HIGH (ref 0.44–1.00)
GFR, Estimated: 47 mL/min — ABNORMAL LOW (ref >60)
Glucose, Bld: 375 mg/dL — ABNORMAL HIGH (ref 70–99)
Potassium: 4.9 mmol/L (ref 3.5–5.1)
Sodium: 130 mmol/L — ABNORMAL LOW (ref 135–145)
Total Bilirubin: 0.5 mg/dL (ref 0.3–1.2)
Total Protein: 6 g/dL — ABNORMAL LOW (ref 6.5–8.1)

## 2021-02-20 LAB — POCT FASTING CBG KUC MANUAL ENTRY: POCT Glucose (KUC): 139 mg/dL — AB (ref 70–99)

## 2021-02-20 MED ORDER — NAPROXEN 500 MG PO TABS: 500.0000 mg | ORAL_TABLET | Freq: Once | ORAL | 2 refills | Status: AC | PRN

## 2021-02-20 MED ORDER — SODIUM CHLORIDE 0.9 % IV BOLUS
500.0000 mL | Freq: Once | INTRAVENOUS | Status: AC
  Administered 2021-02-20: 500 mL via INTRAVENOUS

## 2021-02-20 MED ORDER — NAPROXEN 500 MG PO TABS: 500.0000 mg | ORAL_TABLET | Freq: Once | ORAL | 2 refills | Status: DC | PRN

## 2021-02-20 MED ORDER — METOCLOPRAMIDE HCL 10 MG PO TABS: 10.0000 mg | ORAL_TABLET | Freq: Three times a day (TID) | ORAL | 1 refills | Status: AC | PRN

## 2021-02-20 MED ORDER — METOCLOPRAMIDE HCL 10 MG PO TABS: 10.0000 mg | ORAL_TABLET | Freq: Three times a day (TID) | ORAL | 1 refills | Status: DC

## 2021-02-20 MED ORDER — MORPHINE SULFATE (PF) 4 MG/ML IV SOLN
4.0000 mg | Freq: Once | INTRAVENOUS | Status: AC
  Administered 2021-02-20: 4 mg via INTRAVENOUS
  Filled 2021-02-20: qty 1

## 2021-02-20 MED ORDER — DIPHENHYDRAMINE HCL 50 MG/ML IJ SOLN
25.0000 mg | Freq: Once | INTRAMUSCULAR | Status: AC
  Administered 2021-02-20: 25 mg via INTRAVENOUS
  Filled 2021-02-20: qty 1

## 2021-02-20 MED ORDER — HEPARIN SOD (PORK) LOCK FLUSH 100 UNIT/ML IV SOLN
500.0000 [IU] | Freq: Once | INTRAVENOUS | Status: AC
  Administered 2021-02-20: 500 [IU]
  Filled 2021-02-20: qty 5

## 2021-02-20 MED ORDER — METOCLOPRAMIDE HCL 5 MG/ML IJ SOLN
10.0000 mg | Freq: Once | INTRAMUSCULAR | Status: AC
  Administered 2021-02-20: 10 mg via INTRAVENOUS
  Filled 2021-02-20: qty 2

## 2021-02-20 NOTE — ED Notes (Signed)
Pt. Does not want to take her sunglasses off

## 2021-02-20 NOTE — ED Provider Notes (Addendum)
[facility]   MRN: [id number] DOB: [birth year]  Subjective:   Tricia Boone is a 30 y.o. female presenting for 3-day history of acute onset severe left-sided headache rated 10 out of 10 not responding to Tylenol and/or ibuprofen.  Patient has also had recurrent watering of both of her eyes, left-sided severe eye pain.  Pain radiates internally into her head and down the left side of her face.  Denies any active chest pain, shortness of breath, diaphoresis.  She did have nausea and vomiting on Tuesday but none since then.  Denies weakness, new numbness or tingling apart from her usual neuropathy.  She is a type I diabetic, has a history of congestive heart failure, kidney disease, acute encephalopathy.  No current facility-administered medications for this encounter.  Current Outpatient Medications:    diazepam (VALIUM) 5 MG tablet, Take 1 tablet (5 mg total) by mouth every 8 (eight) hours as needed (TMJ spasms; may cause drowsiness). (Patient taking differently: Take 5 mg by mouth every 8 (eight) hours as needed (for TMJ spasms- may cause drowsiness). ), Disp: 20 tablet, Rfl: 0   gabapentin (NEURONTIN) 300 MG capsule, Take 300 mg by mouth 3 (three) times daily., Disp: , Rfl:    insulin aspart (NOVOLOG) 100 UNIT/ML injection, Inject 10 Units into the skin 3 (three) times daily with meals., Disp: , Rfl:    insulin detemir (LEVEMIR) 100 UNIT/ML injection, 30 units in AM, 10units in PM till evaluation by PCP, Disp: , Rfl:    loratadine (CLARITIN) 10 MG tablet, Take 1 tablet (10 mg total) by mouth daily as needed for up to 10 days for allergies., Disp: , Rfl:    metoCLOPramide (REGLAN) 5 MG tablet, Take 1 tablet (5 mg total) by mouth every 6 (six) hours as needed for nausea., Disp: 30 tablet, Rfl: 0   ondansetron (ZOFRAN) 4 MG tablet, Take 4 mg by mouth every 8 (eight) hours as needed for nausea or vomiting. , Disp: , Rfl:    pantoprazole (PROTONIX) 40 MG tablet, Take 40 mg by mouth  at bedtime. , Disp: , Rfl:    Allergies  Allergen Reactions   Lisinopril Nausea Only    Diaphoretic, too   Oxycodone Itching    Past Medical History:  Diagnosis Date   Congestive heart failure (CHF) (HCC)    Diabetes mellitus without complication (HCC)    GERD (gastroesophageal reflux disease)    High cholesterol    Hypertension      Past Surgical History:  Procedure Laterality Date   CATARACT EXTRACTION Bilateral    CHOLECYSTECTOMY     INCISION AND DRAINAGE BREAST ABSCESS     RETINAL DETACHMENT SURGERY Bilateral     Family History  Problem Relation Age of Onset   Hypertension Other     Social History   Tobacco Use   Smoking status: Never   Smokeless tobacco: Never  Substance Use Topics   Alcohol use: No   Drug use: No    ROS   Objective:   Vitals: BP 112/78 (BP Location: Left Arm)   Pulse (!) 107   Temp 98.4 F (36.9 C) (Oral)   Resp 16   SpO2 97%   Physical Exam Constitutional:      General: She is not in acute distress.    Appearance: Normal appearance. She is well-developed. She is not ill-appearing, toxic-appearing or diaphoretic.  HENT:     Head: Normocephalic and atraumatic.     Nose: Nose normal.  Mouth/Throat:     Mouth: Mucous membranes are moist.     Pharynx: Oropharynx is clear.  Eyes:     General: No scleral icterus.    Extraocular Movements: Extraocular movements intact.     Pupils: Pupils are equal, round, and reactive to light.     Comments: Bilateral eye redness, clear drainage worse over the left.  Cardiovascular:     Rate and Rhythm: Normal rate and regular rhythm.     Pulses: Normal pulses.     Heart sounds: Normal heart sounds. No murmur heard.   No friction rub. No gallop.  Pulmonary:     Effort: Pulmonary effort is normal. No respiratory distress.     Breath sounds: Normal breath sounds. No stridor. No wheezing, rhonchi or rales.  Skin:    General: Skin is warm and dry.     Findings: No rash.  Neurological:      General: No focal deficit present.     Mental Status: She is alert and oriented to person, place, and time.     Cranial Nerves: No facial asymmetry.     Comments: Negative Romberg and pronator drift.  Psychiatric:        Mood and Affect: Mood normal.        Behavior: Behavior normal.        Thought Content: Thought content normal.   Results for orders placed or performed during the hospital encounter of 02/20/21 (from the past 24 hour(s))  POCT CBG (manual entry)     Status: Abnormal   Collection Time: 02/20/21  1:18 PM  Result Value Ref Range   POCT Glucose ([facility]) 139 (A) 70 - 99 mg/dL   ED ECG REPORT   Date: 02/20/2021  EKG Time: 2:15 PM  Rate: 99 bpm  Rhythm: normal sinus rhythm,  normal EKG, normal sinus rhythm, unchanged from previous tracings  Axis: Normal  Intervals:none  ST&T Change: Nonspecific T wave flattening in lead aVL  Narrative Interpretation: Sinus rhythm at 99 bpm with nonspecific T wave changes above, improved from previous EKG.  Assessment and Plan :   PDMP not reviewed this encounter.  1. Severe headache   2. Eye pain, bilateral   3. Redness of left eye   4. Type 1 diabetes mellitus with hyperglycemia (HCC)   5. History of congestive heart failure     Patient is in need of a higher level of care than we can provide in the urgent care setting.  She has severe refractory headache with left-sided facial pain, eye pain and a history of acute encephalopathy.  Emphasized need for further evaluation including consideration for head CT scan, CT of the orbit in the emergency room/hospital.  Patient presents with a family member, contracts for safety and will take her to the emergency room now.    Wallis Bamberg, PA-C 02/20/21 1416

## 2021-02-20 NOTE — Discharge Instructions (Addendum)
Dear Tricia Boone, Thank you for trusting [place] with your care today. Today we evaluated you for headache.  We obtained CT scans of your head and eyes.  The CT scans did not show any acute intracranial abnormalities that would be concerning for brain bleed abscess or tumor.  Given these findings we feel that your headache is most likely a migraine.  We will give you some naproxen to take for pain as needed as well as some Reglan.  Please follow-up with your ophthalmologist as well as a primary care provider in the next 1 to 2 weeks.  We also obtained a metabolic panel and noted that your kidneys were not working as well as they typically do.  For this we provided you with some gentle IV hydration.

## 2021-02-20 NOTE — ED Provider Notes (Signed)
[facility] EMERGENCY DEPARTMENT Provider Note   CSN: [id number] Arrival date & time: 02/20/21  1531     History No chief complaint on file.   Tricia Boone is a 30 y.o. female.  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  Who presents with complaints of headache for the past 3 days. She has never had a headache like this before.  Headache is predominantly on the left side of her head.  She endorses feeling pain in her eye as well. Lacrimation and nasal discharge. Endorses associated nausea and vomiting at the onset of the headache however the vomiting is since resolved.  She is also endorsing associated photophobia as well as tearing of her left thigh and some nasal discharge. Tylenol and ibuprofen has not helped. No new weakness/ focal deficits. No new changes in balance. No fever or chills. No abd pain or diarrhea.  She was seen previously at an outside urgent care earlier today and was referred to [facility] for head CT.  HPI     Past Medical History:  Diagnosis Date   Congestive heart failure (CHF) (HCC)    Diabetes mellitus without complication (HCC)    GERD (gastroesophageal reflux disease)    High cholesterol    Hypertension     Patient Active Problem List   Diagnosis Date Noted   DKA, type 1 (HCC) 08/16/2018   ARF (acute renal failure) (HCC) 01/08/2017   DKA (diabetic ketoacidoses) 09/18/2016   Metabolic acidosis 09/18/2016   Acute encephalopathy 09/18/2016   Type 1 diabetes mellitus (HCC) 09/18/2016   SIRS (systemic inflammatory response syndrome) (HCC) 09/18/2016   Renal insufficiency 09/18/2016   Kidney disease 09/18/2016   Intractable vomiting     Past Surgical History:  Procedure Laterality Date   CATARACT EXTRACTION Bilateral    CHOLECYSTECTOMY     INCISION AND DRAINAGE BREAST ABSCESS     RETINAL  DETACHMENT SURGERY Bilateral      OB History   No obstetric history on file.     Family History  Problem Relation Age of Onset   Hypertension Other     Social History   Tobacco Use   Smoking status: Never   Smokeless tobacco: Never  Substance Use Topics   Alcohol use: No   Drug use: No    Home Medications Prior to Admission medications   Medication Sig Start Date End Date Taking? Authorizing Provider  diazepam (VALIUM) 5 MG tablet Take 1 tablet (5 mg total) by mouth every 8 (eight) hours as needed (TMJ spasms; may cause drowsiness). Patient taking differently: Take 5 mg by mouth every 8 (eight) hours as needed (for TMJ spasms- may cause drowsiness).  01/09/18   Molpus, John, MD  gabapentin (NEURONTIN) 300 MG capsule Take 300 mg by mouth 3 (three) times daily.    [provider]  insulin aspart (NOVOLOG) 100 UNIT/ML injection Inject 10 Units into the skin 3 (three) times daily with meals. 08/18/18   Glade Lloyd, MD  insulin detemir (LEVEMIR) 100 UNIT/ML injection 30 units in AM, 10units in PM till evaluation by PCP 08/18/18   Glade Lloyd, MD  loratadine (CLARITIN) 10 MG tablet Take 1 tablet (10 mg total) by mouth daily as needed for up to 10 days for allergies. 08/18/18 08/28/18  Glade Lloyd, MD  metoCLOPramide (REGLAN) 10 MG tablet Take 1 tablet (10  mg total) by mouth 3 (three) times daily as needed for nausea. 02/20/21 03/22/21  Adron Bene, MD  metoCLOPramide (REGLAN) 5 MG tablet Take 1 tablet (5 mg total) by mouth every 6 (six) hours as needed for nausea. 09/24/16   Albertine Grates, MD  naproxen (NAPROSYN) 500 MG tablet Take 1 tablet (500 mg total) by mouth once as needed for up to 7 days. 02/20/21 02/27/21  Adron Bene, MD  ondansetron (ZOFRAN) 4 MG tablet Take 4 mg by mouth every 8 (eight) hours as needed for nausea or vomiting.  07/06/14   [provider]  pantoprazole (PROTONIX) 40 MG tablet Take 40 mg by mouth at bedtime.     [provider]     Allergies    Lisinopril and Oxycodone  Review of Systems   Review of Systems  Constitutional:  Negative for chills, fatigue and fever.  HENT:  Positive for ear pain and rhinorrhea. Negative for facial swelling.   Respiratory:  Negative for cough and shortness of breath.   Cardiovascular:  Negative for chest pain.  Neurological:  Positive for headaches. Negative for dizziness, syncope, facial asymmetry, speech difficulty, weakness, light-headedness and numbness.   Physical Exam Updated Vital Signs BP (!) 165/109   Pulse (!) 104   Temp 98.6 F (37 C) (Oral)   Resp 17   Ht 5\' 2"  (1.575 m)   Wt 59.9 kg   SpO2 100%   BMI 24.14 kg/m   Physical Exam Constitutional:      Appearance: She is normal weight.  HENT:     Head: Atraumatic.     Mouth/Throat:     Mouth: Mucous membranes are moist.     Pharynx: Oropharynx is clear.  Eyes:     Extraocular Movements: Extraocular movements intact.     Comments: Blind in left eye. Only able to see shadows in right.  Cardiovascular:     Rate and Rhythm: Normal rate and regular rhythm.     Pulses: Normal pulses.  Pulmonary:     Effort: Pulmonary effort is normal.     Breath sounds: Normal breath sounds.  Abdominal:     General: Abdomen is flat. Bowel sounds are normal.  Skin:    General: Skin is warm and dry.  Neurological:     General: No focal deficit present.     Mental Status: She is alert and oriented to person, place, and time.  Psychiatric:        Mood and Affect: Mood normal.        Behavior: Behavior normal.    ED Results / Procedures / Treatments   Labs (all labs ordered are listed, but only abnormal results are displayed) Labs Reviewed  CBC WITH DIFFERENTIAL/PLATELET - Abnormal; Notable for the following components:      Result Value   RBC 3.82 (*)    Hemoglobin 10.5 (*)    HCT 32.1 (*)    All other components within normal limits  COMPREHENSIVE METABOLIC PANEL - Abnormal; Notable for the following components:    Sodium 130 (*)    Glucose, Bld 375 (*)    BUN 24 (*)    Creatinine, Ser 1.52 (*)    Calcium 7.7 (*)    Total Protein 6.0 (*)    Albumin 2.9 (*)    AST 43 (*)    GFR, Estimated 47 (*)    All other components within normal limits    EKG None  Radiology CT HEAD WO CONTRAST ( )  Result Date:  02/20/2021 CLINICAL DATA:  Intracranial hemorrhage suspected. EXAM: CT HEAD WITHOUT CONTRAST TECHNIQUE: Contiguous axial images were obtained from the base of the skull through the vertex without intravenous contrast. COMPARISON:  CT orbit 02/20/2021 FINDINGS: Brain: No evidence of large-territorial acute infarction. No parenchymal hemorrhage. No mass lesion. No extra-axial collection. No mass effect or midline shift. No hydrocephalus. Basilar cisterns are patent. Vascular: No hyperdense vessel. Skull: No acute fracture or focal lesion. Sinuses/Orbits: Paranasal sinuses and mastoid air cells are clear. Hyperdense right orbit possibly related to retinal detachment treatment. Please see separately dictated CT orbits 02/20/2021. The left orbit is unremarkable. Other: None. IMPRESSION: 1. No acute intracranial abnormality. 2. Hyperdense right orbit. Please see separately dictated CT orbits 02/20/2021 for intra-ocular findings. Electronically Signed   By: Tish FredericksonMorgane  Naveau M.D.   On: 02/20/2021 17:46   CT Orbits Wo Contrast  Result Date: 02/20/2021 CLINICAL DATA:  Ocular pain EXAM: CT ORBITS WITHOUT CONTRAST TECHNIQUE: Multidetector CT imaging of the orbits was performed using the standard protocol without intravenous contrast. Multiplanar CT image reconstructions were also generated. COMPARISON:  Head CT 11/08/2019 FINDINGS: Orbits: Prior left cataract surgery. There is hyperdense material within the right globe and lens is also hyperdense. Visible paranasal sinuses: Scattered mild paranasal sinus mucosal thickening. Soft tissues: Normal Osseous: No fracture or aggressive lesion. Limited intracranial: No acute  or significant finding. IMPRESSION: Hyperdense material within the right globe, and hyperdense right lens. This is consistent with recent phacoemulsification cataract surgery with intraocular lens placement and hyperdense silicone oil within the globe. If the patient has acute right-sided vision loss, it is possible this could represent intra-ocular hemorrhage. Correlate with history. Prior left cataract surgery without complication. Otherwise no acute findings in the orbits. Electronically Signed   By: Caprice RenshawJacob  Kahn M.D.   On: 02/20/2021 17:52    Procedures Procedures   Medications Ordered in ED Medications  metoCLOPramide (REGLAN) injection 10 mg (10 mg Intravenous Given 02/20/21 1709)  diphenhydrAMINE (BENADRYL) injection 25 mg (25 mg Intravenous Given 02/20/21 1709)  morphine 4 MG/ML injection 4 mg (4 mg Intravenous Given 02/20/21 1713)  sodium chloride 0.9 % bolus 500 mL (0 mLs Intravenous Stopped 02/20/21 1915)  heparin lock flush 100 unit/mL (500 Units Intracatheter Given 02/20/21 1927)    ED Course  I have reviewed the triage vital signs and the nursing notes.  Pertinent labs & imaging results that were available during my care of the patient were reviewed by me and considered in my medical decision making (see chart for details).    MDM Rules/Calculators/A&P                          Patient presenting with new onset severe headache left side she has never had a headache like this before she denies any focal neuro symptoms and no focal neuro deficits on exam however given that this is new to her we obtained head CT to rule out subdural hemorrhage, mass, abscess.   CT head showed no acute intracranial abnormality CT of her orbits did reveal hyperdense right orbit. Given negative head and orbit CT as well as negative CBC feel that migraine headache versus cluster headache more likely.  Will provide pain management naproxen.  Glucose 375 slight elevation in her creatinine 1.52 baseline appears  to be roughly around 1.  She was given some gentle IV hydration due to her history of heart failure. Did not administer insulin as she is a type 1 diabetic and  concern for causing hypoglycemia.. Once she had been adequately hydrated, she was deemed stable for discharge home with pain control.  Naproxen and reglan, will hold off on sumitriptan. Advised to follow up with PCP and ophtho.    Final Clinical Impression(s) / ED Diagnoses Final diagnoses:  None    Rx / DC Orders ED Discharge Orders          Ordered    naproxen (NAPROSYN) 500 MG tablet  Once PRN,   Status:  Discontinued        02/20/21 1857    metoCLOPramide (REGLAN) 10 MG tablet  3 times daily with meals,   Status:  Discontinued        02/20/21 1857    metoCLOPramide (REGLAN) 10 MG tablet  3 times daily PRN        02/20/21 1859    naproxen (NAPROSYN) 500 MG tablet  Once PRN        02/20/21 1900             Adron Bene, MD 02/21/21 0221    Charlynne Pander, MD 02/25/21 [phone]

## 2021-02-20 NOTE — ED Notes (Signed)
Port accessed by [employer] Blood to lab.

## 2021-02-20 NOTE — ED Notes (Signed)
Patient transported to CT 

## 2021-02-20 NOTE — ED Triage Notes (Signed)
Headache x 3 days. Vision loss in her left eye for a year. Legally blind.

## 2021-02-20 NOTE — Discharge Instructions (Addendum)
Please report to the hospital now for further evaluation than we can provide in the urgent care setting. Since you have a severe 10/10 headache, photophobia, left eye pain you will likely need a head CT scan or CT of your orbits. Please do not go home, go straight to the emergency room.

## 2021-02-20 NOTE — ED Notes (Signed)
RN to call when pt is ready to come to CT

## 2021-02-20 NOTE — ED Triage Notes (Signed)
Left sided headache starting Tuesday, reports nausea and vomiting with headache. Reports this is the worse headache she's ever had in her life. Denies any injury to head. 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

## 2022-01-07 ENCOUNTER — Encounter (HOSPITAL_COMMUNITY): Payer: Self-pay

## 2022-01-07 ENCOUNTER — Emergency Department (HOSPITAL_COMMUNITY)
Admission: EM | Admit: 2022-01-07 | Discharge: 2022-01-07 | Disposition: A | Discharge status: Home / Self Care | Payer: Medicare Other | Attending: Emergency Medicine | Admitting: Emergency Medicine

## 2022-01-07 ENCOUNTER — Other Ambulatory Visit: Payer: Self-pay

## 2022-01-07 DIAGNOSIS — M549 Dorsalgia, unspecified: Secondary | ICD-10-CM | POA: Insufficient documentation

## 2022-01-07 DIAGNOSIS — N9489 Other specified conditions associated with female genital organs and menstrual cycle: Secondary | ICD-10-CM | POA: Insufficient documentation

## 2022-01-07 DIAGNOSIS — E162 Hypoglycemia, unspecified: Secondary | ICD-10-CM | POA: Diagnosis present

## 2022-01-07 DIAGNOSIS — Z9641 Presence of insulin pump (external) (internal): Secondary | ICD-10-CM | POA: Insufficient documentation

## 2022-01-07 DIAGNOSIS — I509 Heart failure, unspecified: Secondary | ICD-10-CM | POA: Insufficient documentation

## 2022-01-07 DIAGNOSIS — G8929 Other chronic pain: Secondary | ICD-10-CM | POA: Diagnosis not present

## 2022-01-07 DIAGNOSIS — E11649 Type 2 diabetes mellitus with hypoglycemia without coma: Secondary | ICD-10-CM | POA: Diagnosis not present

## 2022-01-07 HISTORY — DX: Unspecified visual loss: H54.7

## 2022-01-07 LAB — CBC WITH DIFFERENTIAL/PLATELET
Abs Immature Granulocytes: 0.02 10*3/uL (ref 0.00–0.07)
Basophils Absolute: 0 10*3/uL (ref 0.0–0.1)
Basophils Relative: 0 %
Eosinophils Absolute: 0.3 10*3/uL (ref 0.0–0.5)
Eosinophils Relative: 3 %
HCT: 27.6 % — ABNORMAL LOW (ref 36.0–46.0)
Hemoglobin: 8.7 g/dL — ABNORMAL LOW (ref 12.0–15.0)
Immature Granulocytes: 0 %
Lymphocytes Relative: 17 %
Lymphs Abs: 1.4 10*3/uL (ref 0.7–4.0)
MCH: 27.1 pg (ref 26.0–34.0)
MCHC: 31.5 g/dL (ref 30.0–36.0)
MCV: 86 fL (ref 80.0–100.0)
Monocytes Absolute: 0.4 10*3/uL (ref 0.1–1.0)
Monocytes Relative: 5 %
Neutro Abs: 6.1 10*3/uL (ref 1.7–7.7)
Neutrophils Relative %: 75 %
Platelets: 271 10*3/uL (ref 150–400)
RBC: 3.21 MIL/uL — ABNORMAL LOW (ref 3.87–5.11)
RDW: 13.8 % (ref 11.5–15.5)
WBC: 8.2 10*3/uL (ref 4.0–10.5)
nRBC: 0 % (ref 0.0–0.2)

## 2022-01-07 LAB — COMPREHENSIVE METABOLIC PANEL
ALT: 24 U/L (ref 0–44)
AST: 19 U/L (ref 15–41)
Albumin: 2.9 g/dL — ABNORMAL LOW (ref 3.5–5.0)
Alkaline Phosphatase: 78 U/L (ref 38–126)
Anion gap: 10 (ref 5–15)
BUN: 35 mg/dL — ABNORMAL HIGH (ref 6–20)
CO2: 19 mmol/L — ABNORMAL LOW (ref 22–32)
Calcium: 7.3 mg/dL — ABNORMAL LOW (ref 8.9–10.3)
Chloride: 110 mmol/L (ref 98–111)
Creatinine, Ser: 2.26 mg/dL — ABNORMAL HIGH (ref 0.44–1.00)
GFR, Estimated: 29 mL/min — ABNORMAL LOW (ref >60)
Glucose, Bld: 228 mg/dL — ABNORMAL HIGH (ref 70–99)
Potassium: 4.2 mmol/L (ref 3.5–5.1)
Sodium: 139 mmol/L (ref 135–145)
Total Bilirubin: 0.2 mg/dL — ABNORMAL LOW (ref 0.3–1.2)
Total Protein: 6.3 g/dL — ABNORMAL LOW (ref 6.5–8.1)

## 2022-01-07 LAB — URINALYSIS, ROUTINE W REFLEX MICROSCOPIC
Bilirubin Urine: NEGATIVE
Glucose, UA: >=500 mg/dL — AB
Ketones, ur: NEGATIVE mg/dL
Leukocytes,Ua: NEGATIVE
Nitrite: NEGATIVE
Protein, ur: 100 mg/dL — AB
Specific Gravity, Urine: 1.009 (ref 1.005–1.030)
pH: 6 (ref 5.0–8.0)

## 2022-01-07 LAB — I-STAT BETA HCG BLOOD, ED (MC, WL, AP ONLY): I-stat hCG, quantitative: <5 m[IU]/mL (ref <5)

## 2022-01-07 LAB — CBG MONITORING, ED
Glucose-Capillary: 124 mg/dL — ABNORMAL HIGH (ref 70–99)
Glucose-Capillary: 177 mg/dL — ABNORMAL HIGH (ref 70–99)
Glucose-Capillary: 138 mg/dL — ABNORMAL HIGH (ref 70–99)
Glucose-Capillary: 157 mg/dL — ABNORMAL HIGH (ref 70–99)

## 2022-01-07 LAB — RAPID URINE DRUG SCREEN, HOSP PERFORMED
Amphetamines: NOT DETECTED
Barbiturates: NOT DETECTED
Benzodiazepines: NOT DETECTED
Cocaine: NOT DETECTED
Opiates: NOT DETECTED
Tetrahydrocannabinol: NOT DETECTED

## 2022-01-07 MED ORDER — HYDROCODONE-ACETAMINOPHEN 5-325 MG PO TABS
1.0000 | ORAL_TABLET | Freq: Once | ORAL | Status: AC
  Administered 2022-01-07: 1 via ORAL
  Filled 2022-01-07: qty 1

## 2022-01-07 MED ORDER — SODIUM CHLORIDE 0.9 % IV SOLN: 250.0000 mL | INTRAVENOUS | Status: DC | PRN

## 2022-01-07 MED ORDER — HEPARIN SOD (PORK) LOCK FLUSH 100 UNIT/ML IV SOLN
500.0000 [IU] | Freq: Once | INTRAVENOUS | Status: AC
  Administered 2022-01-07: 500 [IU]
  Filled 2022-01-07: qty 5

## 2022-01-07 MED ORDER — SODIUM CHLORIDE 0.9% FLUSH: 3.0000 mL | INTRAVENOUS | Status: DC | PRN

## 2022-01-07 MED ORDER — SODIUM CHLORIDE 0.9% FLUSH
3.0000 mL | Freq: Two times a day (BID) | INTRAVENOUS | Status: DC
  Administered 2022-01-07 (×2): 3 mL via INTRAVENOUS

## 2022-01-07 MED ORDER — HEPARIN SOD (PORK) LOCK FLUSH 100 UNIT/ML IV SOLN: 500.0000 [IU] | INTRAVENOUS | Status: DC | PRN

## 2022-01-07 NOTE — ED Notes (Signed)
D/C instructions reviewed with patient and mother, both verbalized understanding. Patient discharged home with mother, via W/C.

## 2022-01-07 NOTE — ED Notes (Signed)
Pt's mother reports Pt has been having issues w/ "lows" and was recently seen by the Endocrinologist for same.  Mother reports "they changed her numbers" but "it has never been this low."  Pt reports lack of appetite since eye surgery.

## 2022-01-07 NOTE — ED Notes (Signed)
IO removed from left lower leg without difficulty. Patient tolerated well.

## 2022-01-07 NOTE — ED Notes (Signed)
Per PA patient received sandwich bag and drink.

## 2022-01-07 NOTE — ED Provider Notes (Signed)
Care assumed from previous provider at shift change, Plan to FU on labs, if CBG continues to be [facility] can dc home.  31 year old hx of IDDM, CHF, HT, blindness here for evaluation. Sounds like having difficulty with hypoglycemia in the AM Saw endocrinology last week with med adjustment. Not eating as much due to retinal detachment surgery last week. EMS with lack of IV placement put in IO. Gave Glucagon and D10. Mother turned off insulin pump. Physical Exam  BP (!) 131/99   Pulse 90   Resp 15   Ht 5\' 2"  (1.575 m)   Wt 69.4 kg   SpO2 97%   BMI 27.98 kg/m   Physical Exam Vitals and nursing note reviewed.  Constitutional:      General: She is not in acute distress.    Appearance: She is well-developed. She is not ill-appearing.  HENT:     Head: Atraumatic.  Eyes:     Pupils: Pupils are equal, round, and reactive to light.  Cardiovascular:     Rate and Rhythm: Normal rate.     Pulses: Normal pulses.     Heart sounds: Normal heart sounds.  Pulmonary:     Effort: Pulmonary effort is normal. No respiratory distress.     Breath sounds: Normal breath sounds.  Abdominal:     General: Bowel sounds are normal. There is no distension.     Palpations: Abdomen is soft.  Musculoskeletal:        General: Normal range of motion.     Cervical back: Normal range of motion.  Skin:    General: Skin is warm and dry.     Capillary Refill: Capillary refill takes less than 2 seconds.  Neurological:     General: No focal deficit present.     Mental Status: She is alert and oriented to person, place, and time.  Psychiatric:        Mood and Affect: Mood normal.     Procedures  Procedures Labs Reviewed  CBC WITH DIFFERENTIAL/PLATELET - Abnormal; Notable for the following components:      Result Value   RBC 3.21 (*)    Hemoglobin 8.7 (*)    HCT 27.6 (*)    All other components within normal limits  COMPREHENSIVE METABOLIC PANEL - Abnormal; Notable for the following components:   CO2 19 (*)     Glucose, Bld 228 (*)    BUN 35 (*)    Creatinine, Ser 2.26 (*)    Calcium 7.3 (*)    Total Protein 6.3 (*)    Albumin 2.9 (*)    Total Bilirubin 0.2 (*)    GFR, Estimated 29 (*)    All other components within normal limits  CBG MONITORING, ED - Abnormal; Notable for the following components:   Glucose-Capillary 177 (*)    All other components within normal limits  CBG MONITORING, ED - Abnormal; Notable for the following components:   Glucose-Capillary 124 (*)    All other components within normal limits  CBG MONITORING, ED - Abnormal; Notable for the following components:   Glucose-Capillary 138 (*)    All other components within normal limits  RAPID URINE DRUG SCREEN, HOSP PERFORMED  URINALYSIS, ROUTINE W REFLEX MICROSCOPIC  I-STAT BETA HCG BLOOD, ED ([facility], [facility], [facility] ONLY)  CBG MONITORING, ED  CBG MONITORING, ED   No results found.  ED Course / MDM    Medical Decision Making Amount and/or Complexity of Data Reviewed Labs: ordered.  Risk Prescription drug  management.    Reviewed her records from endocrinology at [facility]>> 1 week ago.  Creatinine 1.98 appears to be her baseline.  Sounds like she has been having recurrent episodes of hypoglycemia which endocrinology has been trying to manage by changing her insulin pump regulations. Not eating as much since surgery for detached retina 1 week ago.  Labs and imaging personally viewed and interpreted:  CBC without leukocytosis, hemoglobin 8.7>>8.3 at recent [facility] visits CMP creatinine 2.26 only slightly above her baseline at around 2 Pregnancy test negative UA neg for infection  Has been here in the emergency department for 56.5hours without any hypoglycemia.  She is at her baseline mentation.  When I went to reassess patient for discharge she is stating she is having severe back pain.  She denies any recent falls or injuries.  No bowel or bladder incontinence, saddle paresthesia.  Patient states she has chronic back pain and takes  tramadol for this which she has not been able to take today.  States she typically has to take #2 5o tablets.  States she is followed by pain management.  She has negative straight leg raise bilaterally.  Low suspicion for neurosurgical emergency.  We will attempt to control her pain, make sure she can ambulate.  She is tolerating p.o. intake without difficulty.  Patient reassessed.  Pain back to baseline. Suspect acute on chronic pain. Discussed results with patient, mother in room.  Encourage close follow-up with endocrinology for her blood sugars, encourage patient eating regular meals as I suspect her hyperglycemia episode today was due to her not eating much yesterday for dinner or having her nighttime per bed snack.  The patient has been appropriately medically screened and/or stabilized in the ED. I have low suspicion for any other emergent medical condition which would require further screening, evaluation or treatment in the ED or require inpatient management.  Patient is hemodynamically stable and in no acute distress.  Patient able to ambulate in department prior to ED.  Evaluation does not show acute pathology that would require ongoing or additional emergent interventions while in the emergency department or further inpatient treatment.  I have discussed the diagnosis with the patient and answered all questions.  Pain is been managed while in the emergency department and patient has no further complaints prior to discharge.  Patient is comfortable with plan discussed in room and is stable for discharge at this time.  I have discussed strict return precautions for returning to the emergency department.  Patient was encouraged to follow-up with PCP/specialist refer to at discharge.     Hypoglycemia     Tricia Boone A, PA-C 01/07/22 2012    Tricia Lefevre, MD 01/07/22 2017

## 2022-01-07 NOTE — ED Notes (Signed)
ED Provider at bedside. 

## 2022-01-07 NOTE — ED Triage Notes (Signed)
Per EMS, Pt, from home, presents for hypoglycemia.  Initial CBG 22.  Glucagon and D10 given en route.  Last CBG 99.   Per family, Pt has history of same.  Insulin pump noted.    Pt had a R eye surgery x1 week ago.

## 2022-01-07 NOTE — ED Notes (Signed)
CBG 124 

## 2022-01-07 NOTE — Discharge Instructions (Signed)
Follow-up with eye doctor tomorrow.  Keep close monitor blood sugars.  Return for new worsening symptoms

## 2022-02-11 ENCOUNTER — Emergency Department (HOSPITAL_BASED_OUTPATIENT_CLINIC_OR_DEPARTMENT_OTHER): Payer: Medicare Other

## 2022-02-11 ENCOUNTER — Other Ambulatory Visit: Payer: Self-pay

## 2022-02-11 ENCOUNTER — Emergency Department (HOSPITAL_BASED_OUTPATIENT_CLINIC_OR_DEPARTMENT_OTHER)
Admission: EM | Admit: 2022-02-11 | Discharge: 2022-02-12 | Discharge status: Against Medical Advice | Payer: Medicare Other | Attending: Emergency Medicine | Admitting: Emergency Medicine

## 2022-02-11 ENCOUNTER — Encounter (HOSPITAL_BASED_OUTPATIENT_CLINIC_OR_DEPARTMENT_OTHER): Payer: Self-pay

## 2022-02-11 DIAGNOSIS — W19XXXA Unspecified fall, initial encounter: Secondary | ICD-10-CM

## 2022-02-11 DIAGNOSIS — W109XXA Fall (on) (from) unspecified stairs and steps, initial encounter: Secondary | ICD-10-CM | POA: Insufficient documentation

## 2022-02-11 DIAGNOSIS — R600 Localized edema: Secondary | ICD-10-CM | POA: Insufficient documentation

## 2022-02-11 DIAGNOSIS — E1022 Type 1 diabetes mellitus with diabetic chronic kidney disease: Secondary | ICD-10-CM | POA: Diagnosis not present

## 2022-02-11 DIAGNOSIS — M545 Low back pain, unspecified: Secondary | ICD-10-CM | POA: Diagnosis present

## 2022-02-11 DIAGNOSIS — N183 Chronic kidney disease, stage 3 unspecified: Secondary | ICD-10-CM | POA: Diagnosis not present

## 2022-02-11 DIAGNOSIS — S0990XA Unspecified injury of head, initial encounter: Secondary | ICD-10-CM | POA: Diagnosis not present

## 2022-02-11 DIAGNOSIS — E10649 Type 1 diabetes mellitus with hypoglycemia without coma: Secondary | ICD-10-CM | POA: Insufficient documentation

## 2022-02-11 DIAGNOSIS — Z5321 Procedure and treatment not carried out due to patient leaving prior to being seen by health care provider: Secondary | ICD-10-CM | POA: Insufficient documentation

## 2022-02-11 DIAGNOSIS — M79605 Pain in left leg: Secondary | ICD-10-CM | POA: Insufficient documentation

## 2022-02-11 DIAGNOSIS — Z794 Long term (current) use of insulin: Secondary | ICD-10-CM | POA: Insufficient documentation

## 2022-02-11 DIAGNOSIS — M79672 Pain in left foot: Secondary | ICD-10-CM | POA: Insufficient documentation

## 2022-02-11 DIAGNOSIS — R609 Edema, unspecified: Secondary | ICD-10-CM

## 2022-02-11 DIAGNOSIS — E162 Hypoglycemia, unspecified: Secondary | ICD-10-CM

## 2022-02-11 LAB — BASIC METABOLIC PANEL
Anion gap: 8 (ref 5–15)
BUN: 25 mg/dL — ABNORMAL HIGH (ref 6–20)
CO2: 24 mmol/L (ref 22–32)
Calcium: 6 mg/dL — CL (ref 8.9–10.3)
Chloride: 103 mmol/L (ref 98–111)
Creatinine, Ser: 2.03 mg/dL — ABNORMAL HIGH (ref 0.44–1.00)
GFR, Estimated: 33 mL/min — ABNORMAL LOW (ref >60)
Glucose, Bld: 107 mg/dL — ABNORMAL HIGH (ref 70–99)
Potassium: 4.5 mmol/L (ref 3.5–5.1)
Sodium: 135 mmol/L (ref 135–145)

## 2022-02-11 LAB — CBC WITH DIFFERENTIAL/PLATELET
Abs Immature Granulocytes: 0.03 10*3/uL (ref 0.00–0.07)
Basophils Absolute: 0 10*3/uL (ref 0.0–0.1)
Basophils Relative: 0 %
Eosinophils Absolute: 1.4 10*3/uL — ABNORMAL HIGH (ref 0.0–0.5)
Eosinophils Relative: 13 %
HCT: 24.7 % — ABNORMAL LOW (ref 36.0–46.0)
Hemoglobin: 8 g/dL — ABNORMAL LOW (ref 12.0–15.0)
Immature Granulocytes: 0 %
Lymphocytes Relative: 20 %
Lymphs Abs: 2.1 10*3/uL (ref 0.7–4.0)
MCH: 27.2 pg (ref 26.0–34.0)
MCHC: 32.4 g/dL (ref 30.0–36.0)
MCV: 84 fL (ref 80.0–100.0)
Monocytes Absolute: 0.8 10*3/uL (ref 0.1–1.0)
Monocytes Relative: 8 %
Neutro Abs: 6.1 10*3/uL (ref 1.7–7.7)
Neutrophils Relative %: 59 %
Platelets: 242 10*3/uL (ref 150–400)
RBC: 2.94 MIL/uL — ABNORMAL LOW (ref 3.87–5.11)
RDW: 15.3 % (ref 11.5–15.5)
WBC: 10.4 10*3/uL (ref 4.0–10.5)
nRBC: 0 % (ref 0.0–0.2)

## 2022-02-11 LAB — BRAIN NATRIURETIC PEPTIDE: B Natriuretic Peptide: 234.5 pg/mL — ABNORMAL HIGH (ref 0.0–100.0)

## 2022-02-11 LAB — CBG MONITORING, ED: Glucose-Capillary: 38 mg/dL — CL (ref 70–99)

## 2022-02-11 MED ORDER — FUROSEMIDE 10 MG/ML IJ SOLN
40.0000 mg | Freq: Once | INTRAMUSCULAR | Status: AC
Start: 2022-02-12 — End: 2022-02-12
  Administered 2022-02-12: 40 mg via INTRAVENOUS
  Filled 2022-02-11: qty 4

## 2022-02-11 MED ORDER — DEXTROSE 50 % IV SOLN
INTRAVENOUS | Status: AC
  Administered 2022-02-11: 50 mL via INTRAVENOUS
  Filled 2022-02-11: qty 50

## 2022-02-11 MED ORDER — DEXTROSE 50 % IV SOLN: 1.0000 | Freq: Once | INTRAVENOUS | Status: AC

## 2022-02-11 NOTE — ED Notes (Signed)
Pt is aware she needs a urine sample. She has been offered a bed pan or a potty chair. Pt states she does not have to go at this time.

## 2022-02-11 NOTE — ED Notes (Signed)
Patient transported to X-ray 

## 2022-02-11 NOTE — ED Notes (Addendum)
Mom reports her BS is still dropping checked and is 38 EDP made aware and orders received Insulin pump paused Frozen dinner given with crackers and ginger ale

## 2022-02-11 NOTE — ED Notes (Signed)
Family asking to wait to draw labs from port as patient is a difficult stick

## 2022-02-11 NOTE — ED Notes (Signed)
BS by dexcom 176

## 2022-02-11 NOTE — ED Triage Notes (Signed)
Was admitted recently and given fluids, noticeable BLE edema. States was walking down the stairs yesterday and fell, c/o left leg pain and generalized pain after fall.  Denies SOB. Legally blind

## 2022-02-11 NOTE — ED Notes (Signed)
Dexcom reading 176

## 2022-02-11 NOTE — ED Notes (Signed)
Edp made aware of BS by dexcom, insulin pump restarted

## 2022-02-12 ENCOUNTER — Encounter (HOSPITAL_COMMUNITY): Payer: Self-pay

## 2022-02-12 DIAGNOSIS — M545 Low back pain, unspecified: Secondary | ICD-10-CM | POA: Diagnosis not present

## 2022-02-12 NOTE — ED Provider Notes (Addendum)
[facility] EMERGENCY DEPARTMENT Provider Note   CSN: [id number] Arrival date & time: 02/11/22  1723     History  Chief Complaint  Patient presents with   Leg Swelling   Fall    Tricia Boone is a 31 y.o. female.  Pt complains of pain in her left leg and her low back after falling yesterday.  Pt is blind and was walking down stairs and fell.  Pt reports she hit on her low back.  Pt complains of pain in her foot and her low back.  Patient did not strike her head she did not hit her neck.  Patient's mother reports patient has had swelling in her lower legs since she was admitted to the hospital in July for gastroparesis and vomiting.  Mother reports patient was given 3 L of IV fluids which caused her legs to swell.  Patient is taking furosemide at home without relief from swelling.  Mother reports patient has not been eating or drinking today.  Went to a doctor's appointment earlier today and had difficulty walking.  The history is provided by the patient and a caregiver. No language interpreter was used.  Fall This is a new problem. The problem occurs constantly. The problem has been gradually worsening. Pertinent negatives include no shortness of breath. Nothing aggravates the symptoms. Nothing relieves the symptoms. She has tried nothing for the symptoms.       Home Medications Prior to Admission medications   Medication Sig Start Date End Date Taking? Authorizing Provider  diazepam (VALIUM) 5 MG tablet Take 1 tablet (5 mg total) by mouth every 8 (eight) hours as needed (TMJ spasms; may cause drowsiness). Patient taking differently: Take 5 mg by mouth every 8 (eight) hours as needed (for TMJ spasms- may cause drowsiness).  01/09/18   Molpus, John, MD  gabapentin (NEURONTIN) 300 MG capsule Take 300 mg by mouth 3 (three) times daily.    [provider]  insulin aspart (NOVOLOG) 100 UNIT/ML injection Inject 10 Units into the skin 3 (three) times daily with meals.  08/18/18   Glade Lloyd, MD  insulin detemir (LEVEMIR) 100 UNIT/ML injection 30 units in AM, 10units in PM till evaluation by PCP 08/18/18   Glade Lloyd, MD  loratadine (CLARITIN) 10 MG tablet Take 1 tablet (10 mg total) by mouth daily as needed for up to 10 days for allergies. 08/18/18 08/28/18  Glade Lloyd, MD  metoCLOPramide (REGLAN) 10 MG tablet Take 1 tablet (10 mg total) by mouth 3 (three) times daily as needed for nausea. 02/20/21 03/22/21  Adron Bene, MD  metoCLOPramide (REGLAN) 5 MG tablet Take 1 tablet (5 mg total) by mouth every 6 (six) hours as needed for nausea. 09/24/16   Albertine Grates, MD  ondansetron (ZOFRAN) 4 MG tablet Take 4 mg by mouth every 8 (eight) hours as needed for nausea or vomiting.  07/06/14   [provider]  pantoprazole (PROTONIX) 40 MG tablet Take 40 mg by mouth at bedtime.     [provider]      Allergies    Lisinopril, Oxycodone, and Ibuprofen    Review of Systems   Review of Systems  Respiratory:  Negative for shortness of breath.   Neurological:  Positive for weakness.  All other systems reviewed and are negative.   Physical Exam Updated Vital Signs BP (!) 137/105 (BP Location: Right Arm)   Pulse (!) 113   Temp 98.5 F (36.9 C) (Oral)   Resp 18  Ht 5' 2.5" (1.588 m)   Wt 70.8 kg   SpO2 95%   BMI 28.08 kg/m  Physical Exam Vitals and nursing note reviewed.  Constitutional:      Appearance: She is well-developed.  HENT:     Head: Normocephalic.     Mouth/Throat:     Mouth: Mucous membranes are moist.  Cardiovascular:     Rate and Rhythm: Normal rate and regular rhythm.  Pulmonary:     Effort: Pulmonary effort is normal.  Abdominal:     General: Abdomen is flat. There is no distension.  Musculoskeletal:        General: Swelling and tenderness present.     Cervical back: Normal range of motion and neck supple.     Comments: Edema bilateral lower extremities  Skin:    General: Skin is warm.  Neurological:      General: No focal deficit present.     Mental Status: She is alert and oriented to person, place, and time.  Psychiatric:        Mood and Affect: Mood normal.     ED Results / Procedures / Treatments   Labs (all labs ordered are listed, but only abnormal results are displayed) Labs Reviewed  BRAIN NATRIURETIC PEPTIDE - Abnormal; Notable for the following components:      Result Value   B Natriuretic Peptide 234.5 (*)    All other components within normal limits  CBC WITH DIFFERENTIAL/PLATELET - Abnormal; Notable for the following components:   RBC 2.94 (*)    Hemoglobin 8.0 (*)    HCT 24.7 (*)    Eosinophils Absolute 1.4 (*)    All other components within normal limits  BASIC METABOLIC PANEL - Abnormal; Notable for the following components:   Glucose, Bld 107 (*)    BUN 25 (*)    Creatinine, Ser 2.03 (*)    Calcium 6.0 (*)    GFR, Estimated 33 (*)    All other components within normal limits  CBG MONITORING, ED - Abnormal; Notable for the following components:   Glucose-Capillary 38 (*)    All other components within normal limits  URINALYSIS, ROUTINE W REFLEX MICROSCOPIC  PREGNANCY, URINE    EKG None  Radiology CT Head Wo Contrast  Result Date: 02/11/2022 CLINICAL DATA:  Head trauma, minor (Age >= 65y); Neck trauma, midline tenderness (Age 81-64y) EXAM: CT HEAD WITHOUT CONTRAST CT CERVICAL SPINE WITHOUT CONTRAST TECHNIQUE: Multidetector CT imaging of the head and cervical spine was performed following the standard protocol without intravenous contrast. Multiplanar CT image reconstructions of the cervical spine were also generated. RADIATION DOSE REDUCTION: This exam was performed according to the departmental dose-optimization program which includes automated exposure control, adjustment of the mA and/or kV according to patient size and/or use of iterative reconstruction technique. COMPARISON:  None Available. FINDINGS: CT HEAD FINDINGS Brain: Normal anatomic configuration.  No abnormal intra or extra-axial mass lesion or fluid collection. No abnormal mass effect or midline shift. No evidence of acute intracranial hemorrhage or infarct. Ventricular size is normal. Cerebellum unremarkable. Vascular: Unremarkable Skull: Intact Sinuses/Orbits: Paranasal sinuses are clear. Orbits are unremarkable. Other: Mastoid air cells and middle ear cavities are clear. CT CERVICAL SPINE FINDINGS Alignment: Normal. Skull base and vertebrae: No acute fracture. No primary bone lesion or focal pathologic process. Soft tissues and spinal canal: No prevertebral fluid or swelling. No visible canal hematoma. Disc levels: Intervertebral disc heights are preserved. Prevertebral soft tissues are not thickened on sagittal reformats. Spinal canal is  widely patent. No significant neuroforaminal narrowing. Upper chest: Right pleural effusion is partially visualized at the right apex. Other: Right internal jugular central venous catheter is partially visualized. IMPRESSION: 1. No acute intracranial abnormality. 2. No acute fracture or subluxation of the cervical spine. 3. Right pleural effusion partially visualized at the right apex. Electronically Signed   By: Helyn Numbers M.D.   On: 02/11/2022 22:08   CT Cervical Spine Wo Contrast  Result Date: 02/11/2022 CLINICAL DATA:  Head trauma, minor (Age >= 65y); Neck trauma, midline tenderness (Age 72-64y) EXAM: CT HEAD WITHOUT CONTRAST CT CERVICAL SPINE WITHOUT CONTRAST TECHNIQUE: Multidetector CT imaging of the head and cervical spine was performed following the standard protocol without intravenous contrast. Multiplanar CT image reconstructions of the cervical spine were also generated. RADIATION DOSE REDUCTION: This exam was performed according to the departmental dose-optimization program which includes automated exposure control, adjustment of the mA and/or kV according to patient size and/or use of iterative reconstruction technique. COMPARISON:  None Available.  FINDINGS: CT HEAD FINDINGS Brain: Normal anatomic configuration. No abnormal intra or extra-axial mass lesion or fluid collection. No abnormal mass effect or midline shift. No evidence of acute intracranial hemorrhage or infarct. Ventricular size is normal. Cerebellum unremarkable. Vascular: Unremarkable Skull: Intact Sinuses/Orbits: Paranasal sinuses are clear. Orbits are unremarkable. Other: Mastoid air cells and middle ear cavities are clear. CT CERVICAL SPINE FINDINGS Alignment: Normal. Skull base and vertebrae: No acute fracture. No primary bone lesion or focal pathologic process. Soft tissues and spinal canal: No prevertebral fluid or swelling. No visible canal hematoma. Disc levels: Intervertebral disc heights are preserved. Prevertebral soft tissues are not thickened on sagittal reformats. Spinal canal is widely patent. No significant neuroforaminal narrowing. Upper chest: Right pleural effusion is partially visualized at the right apex. Other: Right internal jugular central venous catheter is partially visualized. IMPRESSION: 1. No acute intracranial abnormality. 2. No acute fracture or subluxation of the cervical spine. 3. Right pleural effusion partially visualized at the right apex. Electronically Signed   By: Helyn Numbers M.D.   On: 02/11/2022 22:08   DG Lumbar Spine Complete  Result Date: 02/11/2022 CLINICAL DATA:  Fall, back pain EXAM: LUMBAR SPINE - COMPLETE 4+ VIEW COMPARISON:  None Available. FINDINGS: Variant anatomy with a partially lumbarized S1. There is no evidence of lumbar spine fracture. Alignment is normal. Intervertebral disc spaces are maintained. IMPRESSION: No acute fracture or listhesis. Electronically Signed   By: Helyn Numbers M.D.   On: 02/11/2022 21:35   DG Pelvis 1-2 Views  Result Date: 02/11/2022 CLINICAL DATA:  Fall, pelvic pain EXAM: PELVIS - 1-2 VIEW COMPARISON:  None Available. FINDINGS: There is no evidence of pelvic fracture or diastasis. No pelvic bone lesions  are seen. IMPRESSION: Negative. Electronically Signed   By: Helyn Numbers M.D.   On: 02/11/2022 21:34   DG Foot Complete Left  Result Date: 02/11/2022 CLINICAL DATA:  Fall, left foot pain EXAM: LEFT FOOT - COMPLETE 3+ VIEW COMPARISON:  None Available. FINDINGS: Normal alignment. No acute fracture or dislocation. Joint spaces are preserved. There is moderate soft tissue swelling of the great toe and left forefoot. IMPRESSION: Soft tissue swelling. No acute fracture or dislocation. Electronically Signed   By: Helyn Numbers M.D.   On: 02/11/2022 21:33   DG Chest Port 1 View  Result Date: 02/11/2022 CLINICAL DATA:  swelling EXAM: PORTABLE CHEST 1 VIEW COMPARISON:  May 09, 2021 FINDINGS: The cardiomediastinal silhouette is unchanged in contour.RIGHT chest port with tip terminating over  the superior cavoatrial junction. Unchanged mild elevation of the RIGHT hemidiaphragm. No pleural effusion. No pneumothorax. No acute pleuroparenchymal abnormality. Visualized abdomen is unremarkable. IMPRESSION: No acute cardiopulmonary abnormality. Electronically Signed   By: Meda Klinefelter M.D.   On: 02/11/2022 19:00    Procedures Procedures    Medications Ordered in ED Medications  furosemide (LASIX) injection 40 mg (has no administration in time range)  dextrose 50 % solution 50 mL (50 mLs Intravenous Given 02/11/22 2003)    ED Course/ Medical Decision Making/ A&P                           Medical Decision Making Amount and/or Complexity of Data Reviewed Independent Historian: parent    Details: Patient's mother provides most of her history patient is insulin-dependent diabetic.  She has a history of renal insufficiency and gastroparesis External Data Reviewed: labs and notes.    Details: Labs from primary care reviewed.  Notes from hospitalization in July reviewed Labs: ordered. Decision-making details documented in ED Course.    Details: Patient's calcium is low at 6.  BNT P is 234 his  hemoglobin is 8 this is at her baseline BUN is 25 creatinine is 2.03 this is at patient's baseline. Radiology: ordered and independent interpretation performed. Decision-making details documented in ED Course.  Risk Prescription drug management. Risk Details: Patient had a drop in her glucose to 38 patient was given D50 1 amp IV.  Patient was able to eat and drink.  RN has continued to monitor patient's DEXA scan and glucose remained stable.  Patient was evaluated by Dr. Manus Gunning.  Patient had a CT scan of her head CT scan of her neck which showed no acute abnormalities patient had an x-ray of her lumbar spine which showed no acute abnormality x-ray of patient's pelvis showed no acute changes x-ray of patient's left foot shows no fracture.  On reevaluation patient is unable to ambulate. Pt continues to still feel weak. I offered pt admission.  Mother unsure if she wants pt admitted to [facility].  Pt's care turned over to Dr. Nicanor Alcon.        I have offered admission Pt does not want to be admitted.  Pt's Mother is thinking about leaving [facility].      Final Clinical Impression(s) / ED Diagnoses Final diagnoses:  Hypoglycemia  Peripheral edema  Fall, initial encounter  Acute low back pain without sciatica, unspecified back pain laterality  Foot pain, left    Rx / DC Orders ED Discharge Orders     None         Osie Cheeks 02/12/22 0050    Elson Areas, PA-C 02/12/22 [id number]    Glynn Octave, MD 02/12/22 1131

## 2022-02-12 NOTE — ED Notes (Signed)
Pt unable to ambulate at this time due to pain RN informed

## 2022-02-12 NOTE — ED Notes (Signed)
Pt and Mother have decided to go to [facility] for further evaluation.  Will be leaving here AMA

## 2022-02-12 NOTE — ED Notes (Signed)
Blood sugar by dexcom 152

## 2022-07-22 IMAGING — CT CT HEAD W/O CM
3 series · 16 of 47 positions shown, 19 images · non-contrast
Comparison: CT orbit 02/20/2021

CLINICAL DATA: Intracranial hemorrhage suspected.

EXAM:
CT HEAD WITHOUT CONTRAST
TECHNIQUE: Contiguous axial images were obtained from the base of the skull
through the vertex without intravenous contrast.

[Series 2: head wo · axial · 0.43mm/px · z∈[+864,+1004]mm · 10 of 34 slices shown, 13 images]
[im 3/34  brain]
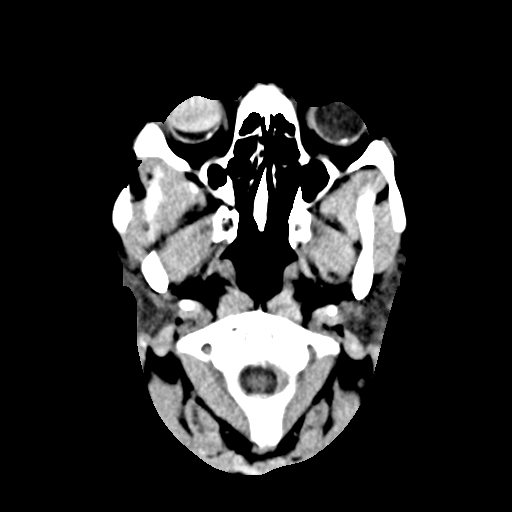
[im 3/34  bone]
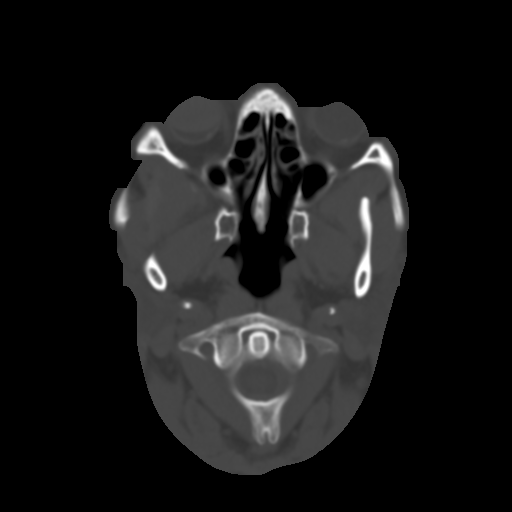
[im 6/34  brain]
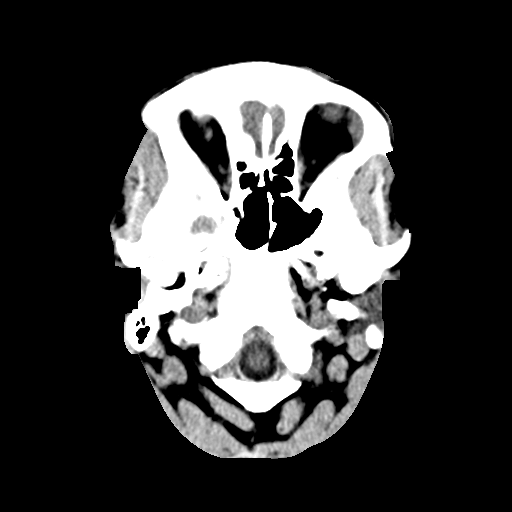
[im 10/34  brain]
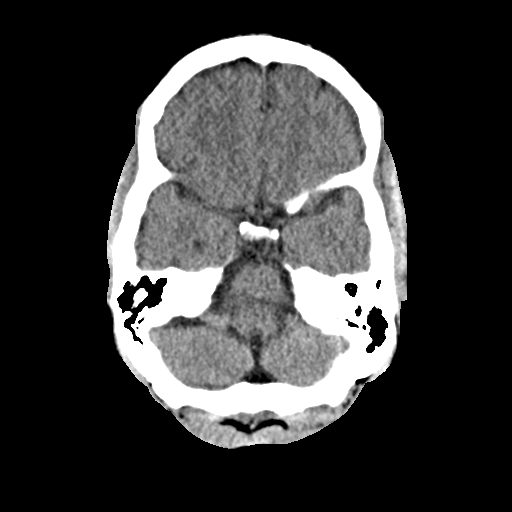
[im 12/34  brain]
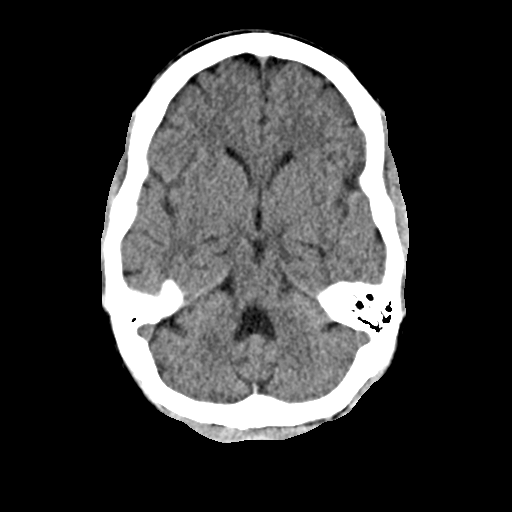
[im 15/34  brain]
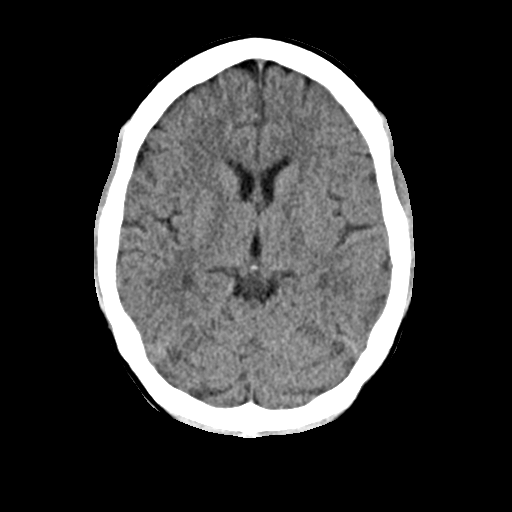
[im 15/34  bone]
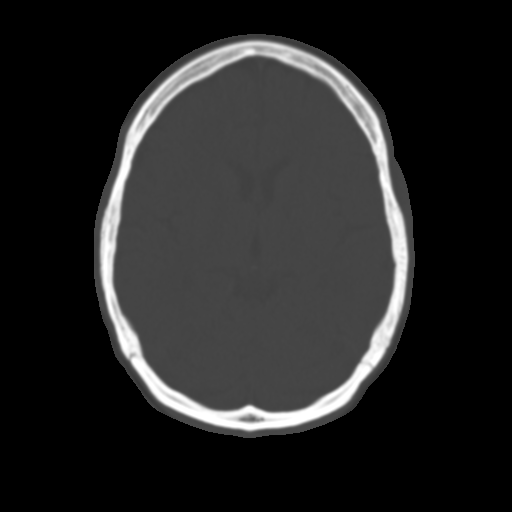
[im 19/34  brain]
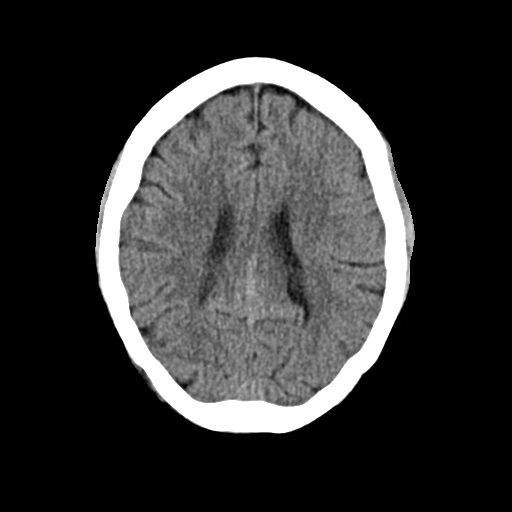
[im 22/34  brain]
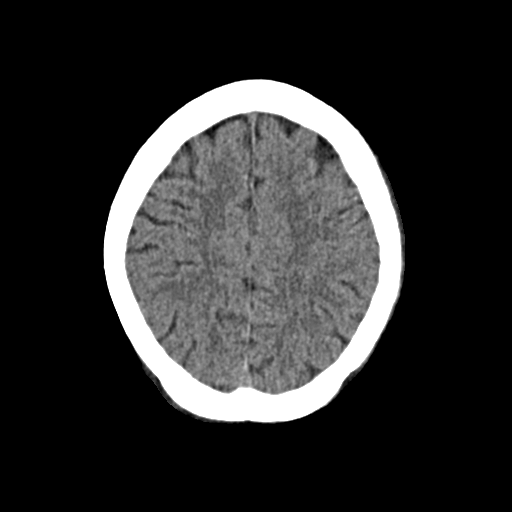
[im 26/34  brain]
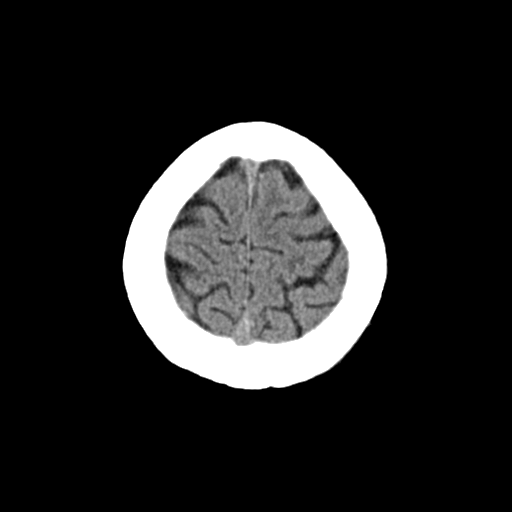
[im 28/34  brain]
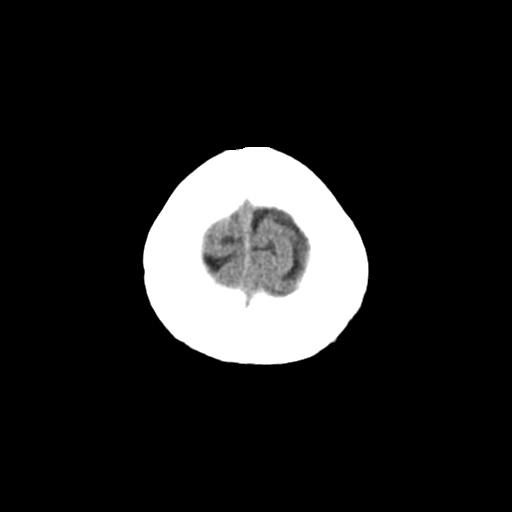
[im 28/34  bone]
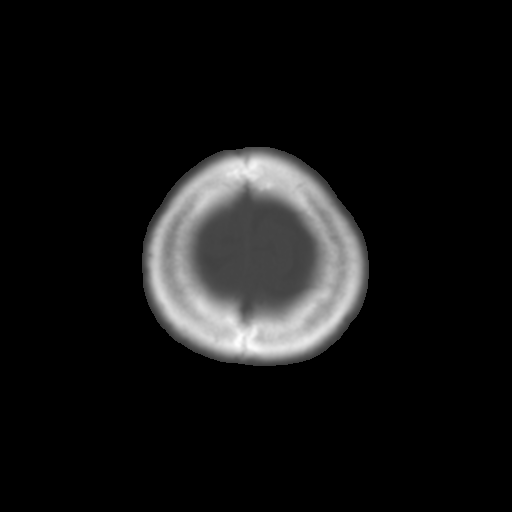
[im 31/34  brain]
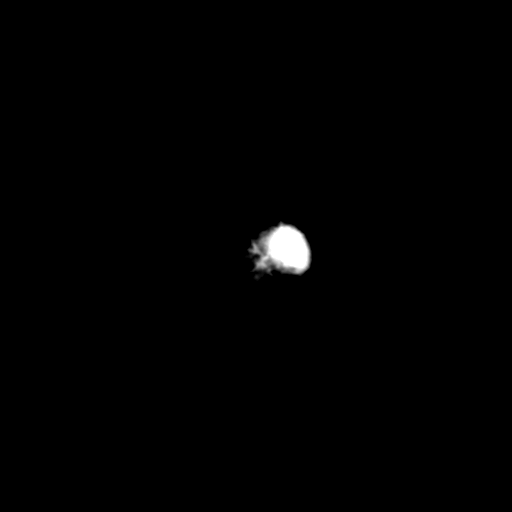

[Series 4: coronal soft · coronal · 0.32mm/px · 3 of 68 slices shown]
[im 25/68  brain]
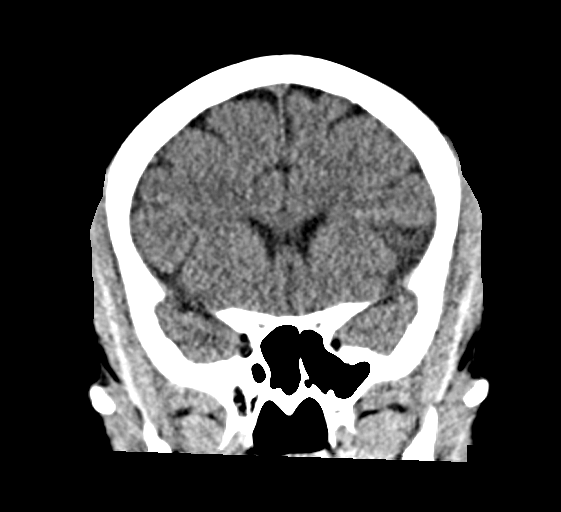
[im 31/68  brain]
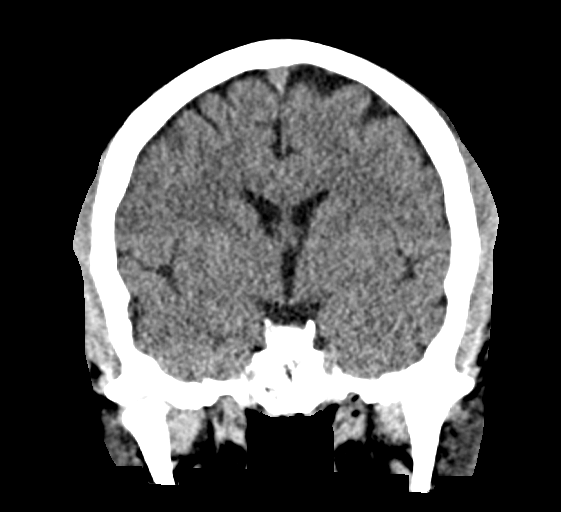
[im 37/68  brain]
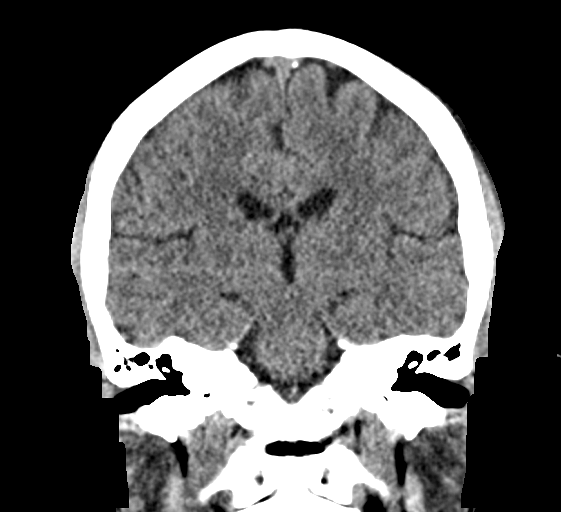

[Series 5: sag soft · sagittal · 0.35mm/px · 3 of 58 slices shown]
[im 20/58  brain]
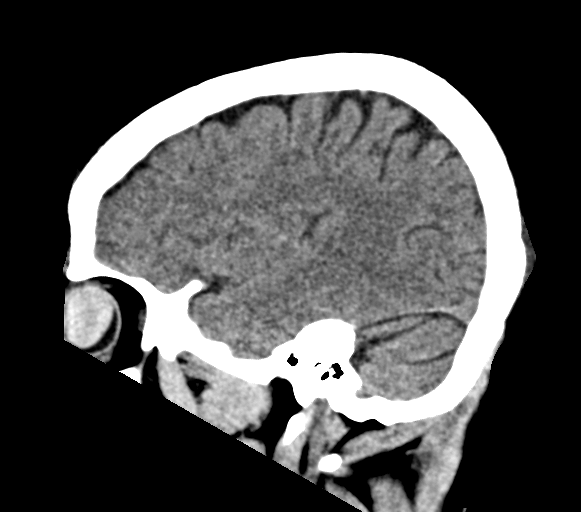
[im 29/58  brain]
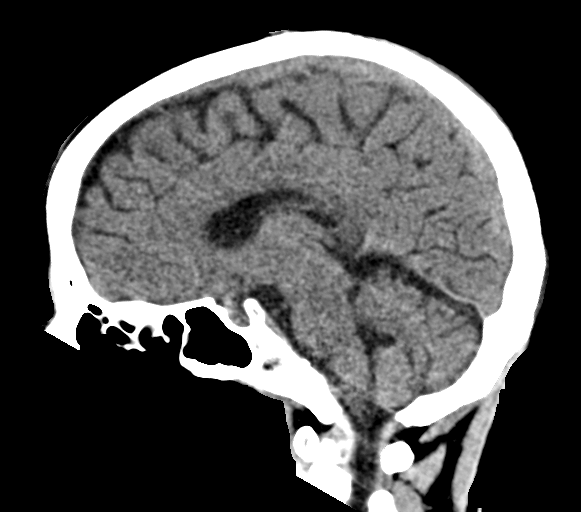
[im 39/58  brain]
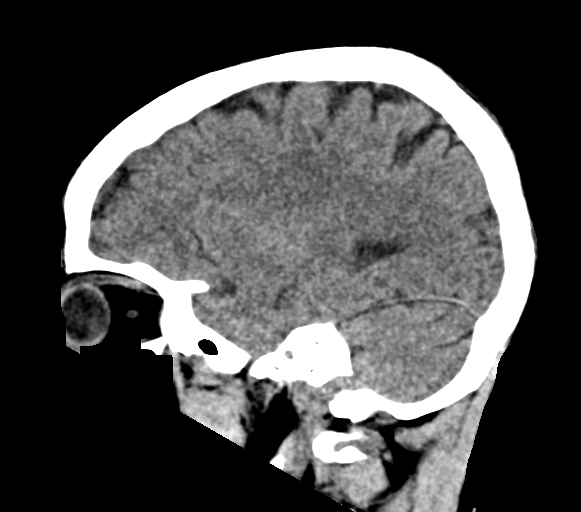

[16 of 47 positions shown; findings below may reference images not displayed]

FINDINGS: Brain:

No evidence of large-territorial acute infarction. No parenchymal
hemorrhage. No mass lesion. No extra-axial collection.

No mass effect or midline shift. No hydrocephalus. Basilar cisterns
are patent.

Vascular: No hyperdense vessel.

Skull: No acute fracture or focal lesion.

Sinuses/Orbits: Paranasal sinuses and mastoid air cells are clear.
Hyperdense right orbit possibly related to retinal detachment
treatment. Please see separately dictated CT orbits 02/20/2021. The
left orbit is unremarkable.

Other: None.
IMPRESSION: 1. No acute intracranial abnormality.
2. Hyperdense right orbit. Please see separately dictated CT orbits
02/20/2021 for intra-ocular findings.
